# Patient Record
Sex: Male | Born: 1940 | Race: White | Hispanic: No | Marital: Married | Smoking: Former smoker
Health system: Southern US, Community
[De-identification: ages and names within clinical notes are randomized; demographics above are authoritative.]

## PROBLEM LIST (undated history)

## (undated) DIAGNOSIS — R040 Epistaxis: Secondary | ICD-10-CM

## (undated) DIAGNOSIS — G4730 Sleep apnea, unspecified: Secondary | ICD-10-CM

## (undated) DIAGNOSIS — J449 Chronic obstructive pulmonary disease, unspecified: Secondary | ICD-10-CM

## (undated) DIAGNOSIS — C801 Malignant (primary) neoplasm, unspecified: Secondary | ICD-10-CM

## (undated) DIAGNOSIS — I499 Cardiac arrhythmia, unspecified: Secondary | ICD-10-CM

## (undated) DIAGNOSIS — L719 Rosacea, unspecified: Secondary | ICD-10-CM

## (undated) DIAGNOSIS — H9193 Unspecified hearing loss, bilateral: Secondary | ICD-10-CM

## (undated) DIAGNOSIS — M1990 Unspecified osteoarthritis, unspecified site: Secondary | ICD-10-CM

## (undated) DIAGNOSIS — R51 Headache: Secondary | ICD-10-CM

## (undated) DIAGNOSIS — K219 Gastro-esophageal reflux disease without esophagitis: Secondary | ICD-10-CM

## (undated) DIAGNOSIS — Z9109 Other allergy status, other than to drugs and biological substances: Secondary | ICD-10-CM

## (undated) DIAGNOSIS — K649 Unspecified hemorrhoids: Secondary | ICD-10-CM

## (undated) DIAGNOSIS — E7800 Pure hypercholesterolemia, unspecified: Secondary | ICD-10-CM

## (undated) DIAGNOSIS — J45909 Unspecified asthma, uncomplicated: Secondary | ICD-10-CM

## (undated) DIAGNOSIS — R0602 Shortness of breath: Secondary | ICD-10-CM

## (undated) DIAGNOSIS — Z8619 Personal history of other infectious and parasitic diseases: Secondary | ICD-10-CM

## (undated) DIAGNOSIS — H409 Unspecified glaucoma: Secondary | ICD-10-CM

## (undated) DIAGNOSIS — Z87442 Personal history of urinary calculi: Secondary | ICD-10-CM

## (undated) DIAGNOSIS — I10 Essential (primary) hypertension: Secondary | ICD-10-CM

## (undated) HISTORY — PX: TYMPANOSTOMY TUBE PLACEMENT: SHX32

## (undated) HISTORY — PX: SKIN CANCER DESTRUCTION: SHX778

## (undated) HISTORY — PX: HERNIA REPAIR: SHX51

## (undated) HISTORY — PX: ORCHIECTOMY: SHX2116

## (undated) HISTORY — PX: HEMORRHOID SURGERY: SHX153

## (undated) HISTORY — PX: JOINT REPLACEMENT: SHX530

---

## 1969-05-02 DIAGNOSIS — Z87442 Personal history of urinary calculi: Secondary | ICD-10-CM

## 1969-05-02 HISTORY — PX: PROSTATE SURGERY: SHX751

## 1969-05-02 HISTORY — DX: Personal history of urinary calculi: Z87.442

## 1969-05-02 HISTORY — PX: MOUTH SURGERY: SHX715

## 1979-05-03 HISTORY — PX: VASECTOMY: SHX75

## 1998-09-01 HISTORY — PX: CATARACT EXTRACTION, BILATERAL: SHX1313

## 1998-09-01 HISTORY — PX: EYE SURGERY: SHX253

## 1998-09-01 HISTORY — PX: CARPAL TUNNEL RELEASE: SHX101

## 2000-04-06 ENCOUNTER — Encounter: Admission: RE | Admit: 2000-04-06 | Discharge: 2000-04-06 | Payer: Self-pay | Admitting: Urology

## 2000-04-06 ENCOUNTER — Encounter: Payer: Self-pay | Admitting: Urology

## 2000-04-13 ENCOUNTER — Encounter: Admission: RE | Admit: 2000-04-13 | Discharge: 2000-04-13 | Payer: Self-pay | Admitting: Urology

## 2000-04-13 ENCOUNTER — Encounter: Payer: Self-pay | Admitting: Urology

## 2001-03-23 ENCOUNTER — Ambulatory Visit (HOSPITAL_BASED_OUTPATIENT_CLINIC_OR_DEPARTMENT_OTHER): Admission: RE | Admit: 2001-03-23 | Discharge: 2001-03-24 | Payer: Self-pay | Admitting: Orthopedic Surgery

## 2003-10-16 ENCOUNTER — Ambulatory Visit (HOSPITAL_BASED_OUTPATIENT_CLINIC_OR_DEPARTMENT_OTHER): Admission: RE | Admit: 2003-10-16 | Discharge: 2003-10-16 | Payer: Self-pay | Admitting: Pulmonary Disease

## 2004-07-09 ENCOUNTER — Ambulatory Visit: Payer: Self-pay | Admitting: Pulmonary Disease

## 2004-08-07 ENCOUNTER — Ambulatory Visit (HOSPITAL_BASED_OUTPATIENT_CLINIC_OR_DEPARTMENT_OTHER): Admission: RE | Admit: 2004-08-07 | Discharge: 2004-08-07 | Payer: Self-pay | Admitting: Urology

## 2004-08-07 ENCOUNTER — Ambulatory Visit (HOSPITAL_COMMUNITY): Admission: RE | Admit: 2004-08-07 | Discharge: 2004-08-07 | Payer: Self-pay | Admitting: Urology

## 2004-09-06 ENCOUNTER — Ambulatory Visit: Payer: Self-pay | Admitting: Pulmonary Disease

## 2004-09-12 ENCOUNTER — Ambulatory Visit: Payer: Self-pay | Admitting: Family Medicine

## 2004-10-07 ENCOUNTER — Ambulatory Visit: Payer: Self-pay | Admitting: Pulmonary Disease

## 2004-11-05 ENCOUNTER — Ambulatory Visit: Payer: Self-pay | Admitting: Family Medicine

## 2005-01-07 ENCOUNTER — Ambulatory Visit: Payer: Self-pay | Admitting: Family Medicine

## 2005-01-14 ENCOUNTER — Ambulatory Visit: Payer: Self-pay | Admitting: Pulmonary Disease

## 2005-05-06 ENCOUNTER — Ambulatory Visit: Payer: Self-pay | Admitting: Family Medicine

## 2005-07-01 ENCOUNTER — Ambulatory Visit: Payer: Self-pay | Admitting: Pulmonary Disease

## 2005-07-08 ENCOUNTER — Ambulatory Visit (HOSPITAL_COMMUNITY): Admission: RE | Admit: 2005-07-08 | Discharge: 2005-07-08 | Payer: Self-pay | Admitting: Orthopedic Surgery

## 2005-07-08 ENCOUNTER — Ambulatory Visit (HOSPITAL_BASED_OUTPATIENT_CLINIC_OR_DEPARTMENT_OTHER): Admission: RE | Admit: 2005-07-08 | Discharge: 2005-07-08 | Payer: Self-pay | Admitting: Orthopedic Surgery

## 2005-07-28 ENCOUNTER — Ambulatory Visit (HOSPITAL_BASED_OUTPATIENT_CLINIC_OR_DEPARTMENT_OTHER): Admission: RE | Admit: 2005-07-28 | Discharge: 2005-07-28 | Payer: Self-pay | Admitting: Pulmonary Disease

## 2005-08-06 ENCOUNTER — Ambulatory Visit: Payer: Self-pay | Admitting: Pulmonary Disease

## 2005-08-12 ENCOUNTER — Ambulatory Visit: Payer: Self-pay | Admitting: Pulmonary Disease

## 2005-08-26 ENCOUNTER — Ambulatory Visit: Payer: Self-pay | Admitting: Family Medicine

## 2005-10-01 ENCOUNTER — Ambulatory Visit: Payer: Self-pay | Admitting: Pulmonary Disease

## 2005-12-16 ENCOUNTER — Ambulatory Visit: Payer: Self-pay | Admitting: Pulmonary Disease

## 2006-02-24 ENCOUNTER — Ambulatory Visit: Payer: Self-pay | Admitting: Pulmonary Disease

## 2006-05-25 ENCOUNTER — Ambulatory Visit: Payer: Self-pay | Admitting: Pulmonary Disease

## 2006-11-09 ENCOUNTER — Ambulatory Visit: Payer: Self-pay | Admitting: Pulmonary Disease

## 2007-06-24 ENCOUNTER — Ambulatory Visit: Payer: Self-pay | Admitting: Pulmonary Disease

## 2007-07-16 DIAGNOSIS — I10 Essential (primary) hypertension: Secondary | ICD-10-CM | POA: Insufficient documentation

## 2007-07-16 DIAGNOSIS — G4733 Obstructive sleep apnea (adult) (pediatric): Secondary | ICD-10-CM

## 2007-07-16 DIAGNOSIS — E7800 Pure hypercholesterolemia, unspecified: Secondary | ICD-10-CM

## 2007-07-16 DIAGNOSIS — J342 Deviated nasal septum: Secondary | ICD-10-CM

## 2007-08-13 ENCOUNTER — Encounter: Payer: Self-pay | Admitting: Pulmonary Disease

## 2007-08-30 ENCOUNTER — Ambulatory Visit: Payer: Self-pay | Admitting: Pulmonary Disease

## 2007-12-20 ENCOUNTER — Ambulatory Visit: Payer: Self-pay | Admitting: Pulmonary Disease

## 2007-12-27 ENCOUNTER — Ambulatory Visit (HOSPITAL_BASED_OUTPATIENT_CLINIC_OR_DEPARTMENT_OTHER): Admission: RE | Admit: 2007-12-27 | Discharge: 2007-12-27 | Payer: Self-pay | Admitting: Pulmonary Disease

## 2007-12-27 ENCOUNTER — Ambulatory Visit: Payer: Self-pay | Admitting: Pulmonary Disease

## 2008-02-08 ENCOUNTER — Ambulatory Visit: Payer: Self-pay | Admitting: Pulmonary Disease

## 2008-02-09 ENCOUNTER — Telehealth (INDEPENDENT_AMBULATORY_CARE_PROVIDER_SITE_OTHER): Payer: Self-pay | Admitting: *Deleted

## 2008-06-06 ENCOUNTER — Ambulatory Visit: Payer: Self-pay | Admitting: Pulmonary Disease

## 2008-06-06 DIAGNOSIS — J310 Chronic rhinitis: Secondary | ICD-10-CM

## 2008-12-11 ENCOUNTER — Ambulatory Visit: Payer: Self-pay | Admitting: Pulmonary Disease

## 2009-03-23 ENCOUNTER — Ambulatory Visit: Payer: Self-pay | Admitting: Pulmonary Disease

## 2009-10-03 ENCOUNTER — Ambulatory Visit (HOSPITAL_BASED_OUTPATIENT_CLINIC_OR_DEPARTMENT_OTHER): Admission: RE | Admit: 2009-10-03 | Discharge: 2009-10-03 | Payer: Self-pay | Admitting: Orthopedic Surgery

## 2009-10-04 ENCOUNTER — Ambulatory Visit: Payer: Self-pay | Admitting: Vascular Surgery

## 2009-10-04 ENCOUNTER — Encounter (INDEPENDENT_AMBULATORY_CARE_PROVIDER_SITE_OTHER): Payer: Self-pay | Admitting: Orthopedic Surgery

## 2009-10-04 ENCOUNTER — Ambulatory Visit
Admission: RE | Admit: 2009-10-04 | Discharge: 2009-10-04 | Payer: Self-pay | Source: Home / Self Care | Admitting: Orthopedic Surgery

## 2009-11-19 ENCOUNTER — Ambulatory Visit: Payer: Self-pay | Admitting: Pulmonary Disease

## 2009-11-19 DIAGNOSIS — R040 Epistaxis: Secondary | ICD-10-CM | POA: Insufficient documentation

## 2010-04-17 ENCOUNTER — Inpatient Hospital Stay (HOSPITAL_COMMUNITY): Admission: RE | Admit: 2010-04-17 | Discharge: 2010-04-22 | Payer: Self-pay | Admitting: Orthopedic Surgery

## 2010-10-01 NOTE — Assessment & Plan Note (Signed)
Summary: rov//mbw   Primary Provider/Referring Provider:  Dr. Dina Rich  CC:  OSA 6 month follow-up.  The patient says he is doing well on CPAP.  No complaints today.Marc Gutierrez  History of Present Illness: I saw Mr. Mckinstry in follow up for his moderate OSA on BiPAP 15/11.  He has been sleeping well since he got his new mask.  His wife does not hear him snore, when he using his machine.  He notices a difference when he does not use his machine for the whole night.  He feels his energy level is good during the day, and does not feel the need to nap as much as before.  He does feel the air in his mask gets dry.  He is not sure how to use the humidifer.  He has been getting occasional nose bleeds.      Current Medications (verified): 1)  Multivitamins   Tabs (Multiple Vitamin) .... Take One Tab By Mouth Once Daily 2)  Simvastatin 40 Mg Tabs (Simvastatin) .Marc Gutierrez.. 1 By Mouth Daily 3)  Niaspan 750 Mg Cr-Tabs (Niacin (Antihyperlipidemic)) .Marc Gutierrez.. 1 By Mouth Daily 4)  Fenofibrate Micronized 200 Mg Caps (Fenofibrate Micronized) .Marc Gutierrez.. 1 By Mouth Daily 5)  Lisinopril-Hydrochlorothiazide 20-25 Mg Tabs (Lisinopril-Hydrochlorothiazide) .Marc Gutierrez.. 1 By Mouth Daily  Allergies (verified): No Known Drug Allergies  Past History:  Past Medical History: Reviewed history from 12/11/2008 and no changes required. 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 Osborn Coho; not a good surgical candidate Hypertension Dyslipidemia Bladder outlet obstruction  Past Surgical History: Reviewed history from 12/11/2008 and no changes required. Left Carpal Tunnel      - Dr. Molly Maduro Sypher 07/02 Cystoscopy       - Dr. Larey Dresser 12/05 Right Carpal Tunnel      - Dr. Josephine Igo 11/06  Vital Signs:  Patient profile:   70 year old male Height:      71 inches (180.34 cm) Weight:      227 pounds (103.18 kg) BMI:      31.77 O2 Sat:      96 % on Room air Temp:     98.3 degrees F (36.83 degrees C) oral Pulse rate:   101 / minute BP sitting:   132 / 66  (right arm) Cuff size:   large  Vitals Entered By: Michel Bickers CMA (November 19, 2009 3:06 PM)  O2 Sat at Rest %:  96 O2 Flow:  Room air  Physical Exam  General:  normal appearance and obese.   Nose:  clear nasal discharge and deviated septum.   Mouth:  no oral lesion Neck:  no LAN Lungs:  no wheeze Heart:  regular rhythm, normal rate, and no murmurs.   Extremities:  no edema   Impression & Recommendations:  Problem # 1:  OBSTRUCTIVE SLEEP APNEA (ICD-327.23)  He is doing well on his current set up.  Advised him on how to adjust the humidity on his machine.  Problem # 2:  EPISTAXIS (ICD-784.7) Advised him to follow up with his primary physician if this recurs.  Hopefully adjusting the temperature on his CPAP humidifer will help to alleviate this.  Complete Medication List: 1)  Multivitamins Tabs (Multiple vitamin) .... Take one tab by mouth once daily 2)  Simvastatin 40 Mg Tabs (Simvastatin) .Marc Gutierrez.. 1 by mouth daily  3)  Niaspan 750 Mg Cr-tabs (Niacin (antihyperlipidemic)) .Marc Gutierrez.. 1 by mouth daily 4)  Fenofibrate Micronized 200 Mg Caps (Fenofibrate micronized) .Marc Gutierrez.. 1 by mouth daily 5)  Lisinopril-hydrochlorothiazide 20-25 Mg Tabs (Lisinopril-hydrochlorothiazide) .Marc Gutierrez.. 1 by mouth daily  Other Orders: Est. Patient Level III (16109)  Patient Instructions: 1)  Follow up in one year

## 2010-10-21 ENCOUNTER — Other Ambulatory Visit: Payer: Self-pay | Admitting: Orthopedic Surgery

## 2010-10-21 ENCOUNTER — Encounter (HOSPITAL_COMMUNITY): Payer: Medicare Other

## 2010-10-21 ENCOUNTER — Other Ambulatory Visit (HOSPITAL_COMMUNITY): Payer: Self-pay

## 2010-10-21 DIAGNOSIS — Z01810 Encounter for preprocedural cardiovascular examination: Secondary | ICD-10-CM | POA: Insufficient documentation

## 2010-10-21 DIAGNOSIS — Z01812 Encounter for preprocedural laboratory examination: Secondary | ICD-10-CM | POA: Insufficient documentation

## 2010-10-21 LAB — COMPREHENSIVE METABOLIC PANEL
AST: 27 U/L (ref 0–37)
Albumin: 4.4 g/dL (ref 3.5–5.2)
BUN: 23 mg/dL (ref 6–23)
Chloride: 105 mEq/L (ref 96–112)
Creatinine, Ser: 1.3 mg/dL (ref 0.4–1.5)
GFR calc Af Amer: >60 mL/min (ref >60)
Total Bilirubin: 0.5 mg/dL (ref 0.3–1.2)
Total Protein: 7.5 g/dL (ref 6.0–8.3)

## 2010-10-21 LAB — CBC
HCT: 39.2 % (ref 39.0–52.0)
Hemoglobin: 12.9 g/dL — ABNORMAL LOW (ref 13.0–17.0)
MCH: 31.3 pg (ref 26.0–34.0)
MCHC: 32.9 g/dL (ref 30.0–36.0)
MCV: 95.1 fL (ref 78.0–100.0)
Platelets: 221 10*3/uL (ref 150–400)
RBC: 4.12 MIL/uL — ABNORMAL LOW (ref 4.22–5.81)
RDW: 14 % (ref 11.5–15.5)
WBC: 5.1 10*3/uL (ref 4.0–10.5)

## 2010-10-21 LAB — URINALYSIS, ROUTINE W REFLEX MICROSCOPIC
Bilirubin Urine: NEGATIVE
Ketones, ur: NEGATIVE mg/dL
Specific Gravity, Urine: 1.021 (ref 1.005–1.030)
Urine Glucose, Fasting: NEGATIVE mg/dL
pH: 5.5 (ref 5.0–8.0)

## 2010-10-21 LAB — APTT: aPTT: 32 seconds (ref 24–37)

## 2010-10-21 LAB — SURGICAL PCR SCREEN: MRSA, PCR: NEGATIVE

## 2010-10-21 LAB — PROTIME-INR: INR: 1.04 (ref 0.00–1.49)

## 2010-10-28 ENCOUNTER — Inpatient Hospital Stay (HOSPITAL_COMMUNITY)
Admission: RE | Admit: 2010-10-28 | Discharge: 2010-11-02 | DRG: 470 | Disposition: A | Discharge status: Home Health | Payer: Medicare Other | Source: Ambulatory Visit | Attending: Orthopedic Surgery | Admitting: Orthopedic Surgery

## 2010-10-28 DIAGNOSIS — M1710 Unilateral primary osteoarthritis, unspecified knee: Principal | ICD-10-CM | POA: Diagnosis present

## 2010-10-28 DIAGNOSIS — E669 Obesity, unspecified: Secondary | ICD-10-CM | POA: Diagnosis present

## 2010-10-28 DIAGNOSIS — I10 Essential (primary) hypertension: Secondary | ICD-10-CM | POA: Diagnosis present

## 2010-10-28 DIAGNOSIS — D649 Anemia, unspecified: Secondary | ICD-10-CM | POA: Diagnosis not present

## 2010-10-28 DIAGNOSIS — J9819 Other pulmonary collapse: Secondary | ICD-10-CM | POA: Diagnosis not present

## 2010-10-28 DIAGNOSIS — I498 Other specified cardiac arrhythmias: Secondary | ICD-10-CM | POA: Diagnosis not present

## 2010-10-28 DIAGNOSIS — E119 Type 2 diabetes mellitus without complications: Secondary | ICD-10-CM | POA: Diagnosis present

## 2010-10-28 DIAGNOSIS — H409 Unspecified glaucoma: Secondary | ICD-10-CM | POA: Diagnosis present

## 2010-10-28 DIAGNOSIS — I491 Atrial premature depolarization: Secondary | ICD-10-CM | POA: Diagnosis not present

## 2010-10-28 DIAGNOSIS — G4733 Obstructive sleep apnea (adult) (pediatric): Secondary | ICD-10-CM | POA: Diagnosis present

## 2010-10-28 DIAGNOSIS — Z01812 Encounter for preprocedural laboratory examination: Secondary | ICD-10-CM

## 2010-10-28 LAB — TYPE AND SCREEN
ABO/RH(D): A POS
Antibody Screen: NEGATIVE

## 2010-10-28 LAB — GLUCOSE, CAPILLARY: Glucose-Capillary: 145 mg/dL — ABNORMAL HIGH (ref 70–99)

## 2010-10-29 LAB — CBC
MCH: 29.9 pg (ref 26.0–34.0)
MCHC: 32.1 g/dL (ref 30.0–36.0)
MCV: 93.3 fL (ref 78.0–100.0)
Platelets: 228 10*3/uL (ref 150–400)
RBC: 3.41 MIL/uL — ABNORMAL LOW (ref 4.22–5.81)
RDW: 13.6 % (ref 11.5–15.5)

## 2010-10-29 LAB — BASIC METABOLIC PANEL
GFR calc non Af Amer: 49 mL/min — ABNORMAL LOW (ref >60)
Glucose, Bld: 203 mg/dL — ABNORMAL HIGH (ref 70–99)
Potassium: 4.2 mEq/L (ref 3.5–5.1)
Sodium: 133 mEq/L — ABNORMAL LOW (ref 135–145)

## 2010-10-29 LAB — PROTIME-INR
INR: 1.19 (ref 0.00–1.49)
Prothrombin Time: 15.3 seconds — ABNORMAL HIGH (ref 11.6–15.2)

## 2010-10-30 ENCOUNTER — Inpatient Hospital Stay (HOSPITAL_COMMUNITY): Payer: Medicare Other

## 2010-10-30 LAB — BASIC METABOLIC PANEL
Calcium: 8.9 mg/dL (ref 8.4–10.5)
GFR calc Af Amer: >60 mL/min (ref >60)
GFR calc non Af Amer: >60 mL/min (ref >60)
Sodium: 133 mEq/L — ABNORMAL LOW (ref 135–145)

## 2010-10-30 LAB — URINALYSIS, ROUTINE W REFLEX MICROSCOPIC
Nitrite: NEGATIVE
Protein, ur: NEGATIVE mg/dL
Specific Gravity, Urine: 1.015 (ref 1.005–1.030)
Urobilinogen, UA: 1 mg/dL (ref 0.0–1.0)

## 2010-10-30 LAB — PROTIME-INR
INR: 1.51 — ABNORMAL HIGH (ref 0.00–1.49)
Prothrombin Time: 18.4 seconds — ABNORMAL HIGH (ref 11.6–15.2)

## 2010-10-30 LAB — URINE MICROSCOPIC-ADD ON

## 2010-10-30 LAB — CBC
Platelets: 204 10*3/uL (ref 150–400)
RBC: 3.1 MIL/uL — ABNORMAL LOW (ref 4.22–5.81)
WBC: 10.9 10*3/uL — ABNORMAL HIGH (ref 4.0–10.5)

## 2010-10-31 ENCOUNTER — Ambulatory Visit (HOSPITAL_COMMUNITY): Admit: 2010-10-31 | Payer: Self-pay | Admitting: Surgery

## 2010-10-31 DIAGNOSIS — R002 Palpitations: Secondary | ICD-10-CM

## 2010-10-31 LAB — BASIC METABOLIC PANEL
Chloride: 99 mEq/L (ref 96–112)
GFR calc non Af Amer: 57 mL/min — ABNORMAL LOW (ref >60)
Glucose, Bld: 194 mg/dL — ABNORMAL HIGH (ref 70–99)
Potassium: 3.9 mEq/L (ref 3.5–5.1)
Sodium: 133 mEq/L — ABNORMAL LOW (ref 135–145)

## 2010-10-31 LAB — CK TOTAL AND CKMB (NOT AT ARMC)
CK, MB: 0.9 ng/mL (ref 0.3–4.0)
Relative Index: INVALID (ref 0.0–2.5)
Relative Index: INVALID (ref 0.0–2.5)
Total CK: 76 U/L (ref 7–232)

## 2010-10-31 LAB — CBC
MCV: 91.8 fL (ref 78.0–100.0)
WBC: 11.9 10*3/uL — ABNORMAL HIGH (ref 4.0–10.5)

## 2010-10-31 LAB — URINE CULTURE: Culture  Setup Time: 201203010115

## 2010-10-31 LAB — TROPONIN I
Troponin I: 0.02 ng/mL (ref 0.00–0.06)
Troponin I: 0.02 ng/mL (ref 0.00–0.06)

## 2010-10-31 LAB — PROTIME-INR: Prothrombin Time: 24.2 seconds — ABNORMAL HIGH (ref 11.6–15.2)

## 2010-11-01 ENCOUNTER — Inpatient Hospital Stay (HOSPITAL_COMMUNITY): Payer: Medicare Other

## 2010-11-01 DIAGNOSIS — I059 Rheumatic mitral valve disease, unspecified: Secondary | ICD-10-CM

## 2010-11-01 LAB — PROTIME-INR
INR: 2.7 — ABNORMAL HIGH (ref 0.00–1.49)
Prothrombin Time: 28.8 seconds — ABNORMAL HIGH (ref 11.6–15.2)

## 2010-11-01 LAB — CK TOTAL AND CKMB (NOT AT ARMC): Relative Index: INVALID (ref 0.0–2.5)

## 2010-11-01 LAB — CBC
Platelets: 185 10*3/uL (ref 150–400)
RDW: 14.8 % (ref 11.5–15.5)
WBC: 12 10*3/uL — ABNORMAL HIGH (ref 4.0–10.5)

## 2010-11-07 NOTE — H&P (Signed)
NAME:  Marc Gutierrez, Marc Gutierrez                ACCOUNT NO.:  [id number]  MEDICAL RECORD NO.:  [id number]           PATIENT TYPE:  I  LOCATION:  1602                         FACILITY:  [facility]  PHYSICIAN:  Ollen Gross, M.D.    DATE OF BIRTH:  May 29, 1941  DATE OF ADMISSION:  10/28/2010 DATE OF DISCHARGE:  11/02/2010                             HISTORY & PHYSICAL   CHIEF COMPLAINT:  Right knee pain.  HISTORY OF PRESENT ILLNESS:  The patient is a 70 year old male who has been seen by Dr. Lequita Halt for ongoing right knee pain.  He had a left total knee done back in August 2011, doing well with the left knee.  The right knee continues to be problematic and now he presents to have the other side done.  ALLERGIES:  No known drug allergies.  CURRENT MEDICATIONS:  Lisinopril, fenofibrate, omeprazole, hydrochlorothiazide, simvastatin, and Lumigan eye drops.  PAST MEDICAL HISTORY: 1. Cataracts. 2. Glaucoma. 3. History of shingles. 4. Sleep apnea. 5. Right ear hearing loss. 6. Hypertension. 7. Hypercholesterolemia. 8. Reflux disease. 9. Hemorrhoids. 10.Diabetes mellitus. 11.History of renal calculi. 12.Childhood illnesses of measles and mumps. 13.Rosacea. 14.History of skin cancer.  PAST SURGICAL HISTORY:  Hernia repair twice, hemorrhoid surgery, left total knee replacement, skin surgery secondary to skin cancer, and cataract surgery.  FAMILY HISTORY:  Negative.  SOCIAL HISTORY:  Married.  Past smoker.  Three beers over a several- month period, seldom intake of alcohol.  He does have a caregiver lined up.  He does have a living will and healthcare power of attorney.  REVIEW OF SYSTEMS:  GENERAL:  No fever, chills, or night sweats.  NEURO: No seizures, syncope, or paralysis.  RESPIRATORY:  No shortness of breath, productive cough, or hemoptysis.  CARDIOVASCULAR:  No chest pain or orthopnea.  GI:  No nausea, vomiting, diarrhea, or constipation.  GU: No dysuria, hematuria, or discharge.   MUSCULOSKELETAL:  Right knee.  PHYSICAL EXAMINATION:  VITAL SIGNS:  Pulse 84, respirations 12, blood pressure 126/60. GENERAL:  A 70 year old white male, well nourished, well developed, in no acute distress.  He is alert, oriented, and cooperative.  He is accompanied by his wife. HEENT:  Normocephalic, atraumatic.  Pupils are round and reactive.  EOMs intact.  Never wore glasses.  He does have a right ear hearing aid.  He does have dentures, upper and lower. NECK:  Supple. CHEST:  Clear. HEART:  Regular rate and rhythm with a grade 2/6 to 3/6 systolic murmur, best heard over aortic and mitral points.  Also, slightly over pulmonary point. ABDOMEN:  Soft, nontender.  Bowel sounds present. RECTAL:  Not done, not pertinent to present illness. BREASTS:  Not done, not pertinent to present illness. GENITALIA:  Not done, not pertinent to present illness. EXTREMITIES:  Right knee tender to palpation.  No instability.  Marked crepitus is noted on passive range of motion.  IMPRESSION:  Osteoarthritis, right knee.  PLAN:  The patient admitted to [facility] to undergo a right total knee replacement arthroplasty.  Surgery will be performed by Dr. Ollen Gross.  The patient has been seen preop by Dr. Molly Maduro  Dough and felt to be stable for surgery.     Marc Gutierrez, P.A.C.   ______________________________ Ollen Gross, M.D.    ALP/MEDQ  D:  11/03/2010  T:  11/04/2010  Job:  [id number]  cc:   Dina Rich Fax: [phone]  Electronically Signed by Patrica Duel P.A.C. on 11/04/2010 10:48:01 AM Electronically Signed by Ollen Gross M.D. on 11/06/2010 03:47:20 PM

## 2010-11-07 NOTE — Op Note (Signed)
NAME:  Marc Gutierrez, Marc Gutierrez                ACCOUNT NO.:  [id number]  MEDICAL RECORD NO.:  [id number]           PATIENT TYPE:  I  LOCATION:  0006                         FACILITY:  [facility]  PHYSICIAN:  Ollen Gross, M.D.    DATE OF BIRTH:  1940-09-09  DATE OF PROCEDURE:  10/28/2010 DATE OF DISCHARGE:                              OPERATIVE REPORT   PREOPERATIVE DIAGNOSIS:  Osteoarthritis, right knee.  POSTOPERATIVE DIAGNOSIS:  Osteoarthritis, right knee.  PROCEDURE:  Right total knee arthroplasty.  SURGEON:  Ollen Gross, MD  ASSISTANT:  Alexzandrew L. Perkins, PAC  ANESTHESIA:  General.  ESTIMATED BLOOD LOSS:  Minimal.  DRAIN:  Hemovac x1.  TOURNIQUET TIME:  34 minutes at 300 mmHg.  COMPLICATIONS:  None.  CONDITION:  Stable to Recovery.  BRIEF CLINICAL NOTE:  Marc Gutierrez is a 70 year old male who has advanced arthritis of the right knee with progressively worsening pain and dysfunction.  He has had a previous successful left total knee arthroplasty, he presents now for right total knee arthroplasty.  PROCEDURE IN DETAIL:  After successful administration of general anesthetic, a tourniquet was placed high on his right thigh and his right lower extremity was prepped and draped in usual sterile fashion. Extremity was wrapped in an Esmarch, knee flexed, tourniquet inflated to 300 mmHg.  Midline incision was made with a 10 blade through subcutaneous tissue to the level of the extensor mechanism.  A fresh blade was used to make a medial parapatellar arthrotomy.  Soft tissue on the proximal medial tibia was subperiosteally elevated to the joint line with a knife into the semimembranosus bursa with a Cobb elevator.  Soft tissue laterally was elevated with attention being paid to avoid any patellar tendon on tibial tubercle.  The patella was everted, knee flexed to 90 degrees, and ACL and PCL removed.  Drill was used to create a starting hole in the distal femur and the canal was  thoroughly irrigated.  A 5 degree right valgus alignment guide was placed and the distal femoral cutting block was pinned to remove 10 mm off the distal femur.  Distal femoral resection was made with an oscillating saw. Sizing guide was placed and size 4 was most appropriate.  Rotations marked off the epicondylar axis.  Size 4 cutting block was placed and the anterior and posterior chamfer cuts made.  Tibia subluxed forward and the menisci removed.  Extramedullary tibial alignment guide was placed referencing proximally at the medial aspect of the tibial tubercle and distally along the 2nd metatarsal axis and tibial crest.  The block was pinned to remove 2 mm off the more deficient medial side.  Tibial resection was made with an oscillating saw.  Size 5 was most appropriate tibial component and the proximal tibia was prepared with a modular drill and keel punched for the size 5. Femoral preparation was completed with the intercondylar cut for the size 4.  Size 5 mobile-bearing tibial trial, size 4 posterior stabilized femoral trial, and a 10-mm posterior stabilized rotating platform insert trial were placed.  With the 10, full extension was achieved, with excellent varus-valgus and anterior-posterior balance, throughout full  range of motion.  The patella was everted and thickness measured to be 27 mm. Freehand resection was taken to 15 mm, 41 template was placed, lug holes were drilled, trial patella was placed and it tracked normally. Osteophytes removed off the posterior femur with the trial in place. All trials were removed and the cut bone surface was prepared with pulsatile lavage.  Cement was mixed and once ready for implantation, size 5 mobile-bearing tibial tray, size 4 posterior stabilized femur, and a 41 patella were cemented into place, and patella was held with a clamp.  Trial 10-mm insert was placed, knee held in full extension, all extruded cement removed.  When cement  was fully hardened, then the permanent 10 mm posterior stabilized rotating platform insert was placed in the tibial tray.  The wound was copiously irrigated with saline solution and the arthrotomy closed over Hemovac drain with interrupted #1 PDS.  Flexion against gravity was about 135 degrees, patella tracked normally.  Tourniquet was released at total time of 34 minutes. Subcutaneous was then closed with interrupted 2-0 Vicryl and subcuticular with running 4-0 Monocryl.  Catheter for the Marcaine pain pump was placed and pump was initiated.  The incision was then cleaned and dried and Steri-Strips and a bulky sterile dressing applied. He was then placed into a knee immobilizer, awakened, and transported to Recovery in stable condition.     Ollen Gross, M.D.     FA/MEDQ  D:  10/28/2010  T:  10/28/2010  Job:  [id number]  Electronically Signed by Ollen Gross M.D. on 11/06/2010 03:47:16 PM

## 2010-11-14 LAB — GLUCOSE, CAPILLARY
Glucose-Capillary: 113 mg/dL — ABNORMAL HIGH (ref 70–99)
Glucose-Capillary: 113 mg/dL — ABNORMAL HIGH (ref 70–99)
Glucose-Capillary: 117 mg/dL — ABNORMAL HIGH (ref 70–99)
Glucose-Capillary: 123 mg/dL — ABNORMAL HIGH (ref 70–99)
Glucose-Capillary: 125 mg/dL — ABNORMAL HIGH (ref 70–99)
Glucose-Capillary: 132 mg/dL — ABNORMAL HIGH (ref 70–99)
Glucose-Capillary: 141 mg/dL — ABNORMAL HIGH (ref 70–99)
Glucose-Capillary: 157 mg/dL — ABNORMAL HIGH (ref 70–99)
Glucose-Capillary: 158 mg/dL — ABNORMAL HIGH (ref 70–99)
Glucose-Capillary: 163 mg/dL — ABNORMAL HIGH (ref 70–99)
Glucose-Capillary: 193 mg/dL — ABNORMAL HIGH (ref 70–99)
Glucose-Capillary: 194 mg/dL — ABNORMAL HIGH (ref 70–99)
Glucose-Capillary: 97 mg/dL (ref 70–99)

## 2010-11-14 LAB — CBC
HCT: 25.7 % — ABNORMAL LOW (ref 39.0–52.0)
HCT: 28.9 % — ABNORMAL LOW (ref 39.0–52.0)
MCH: 32.5 pg (ref 26.0–34.0)
MCHC: 35.1 g/dL (ref 30.0–36.0)
MCV: 92.9 fL (ref 78.0–100.0)
Platelets: 155 10*3/uL (ref 150–400)
Platelets: 167 10*3/uL (ref 150–400)
Platelets: 200 10*3/uL (ref 150–400)
RBC: 2.39 MIL/uL — ABNORMAL LOW (ref 4.22–5.81)
RBC: 2.4 MIL/uL — ABNORMAL LOW (ref 4.22–5.81)
RBC: 2.77 MIL/uL — ABNORMAL LOW (ref 4.22–5.81)
RDW: 13.6 % (ref 11.5–15.5)
RDW: 14.1 % (ref 11.5–15.5)
RDW: 14.2 % (ref 11.5–15.5)
RDW: 14.2 % (ref 11.5–15.5)
WBC: 6.4 10*3/uL (ref 4.0–10.5)
WBC: 7.6 10*3/uL (ref 4.0–10.5)
WBC: 9.7 10*3/uL (ref 4.0–10.5)
WBC: 9.8 10*3/uL (ref 4.0–10.5)

## 2010-11-14 LAB — HEMOGLOBIN AND HEMATOCRIT, BLOOD: HCT: 26.5 % — ABNORMAL LOW (ref 39.0–52.0)

## 2010-11-14 LAB — CROSSMATCH: ABO/RH(D): A POS

## 2010-11-14 LAB — BASIC METABOLIC PANEL
BUN: 10 mg/dL (ref 6–23)
BUN: 12 mg/dL (ref 6–23)
CO2: 26 mEq/L (ref 19–32)
Calcium: 8.4 mg/dL (ref 8.4–10.5)
Chloride: 99 mEq/L (ref 96–112)
Creatinine, Ser: 1.36 mg/dL (ref 0.4–1.5)
Creatinine, Ser: 1.38 mg/dL (ref 0.4–1.5)
GFR calc non Af Amer: 52 mL/min — ABNORMAL LOW (ref >60)
Glucose, Bld: 207 mg/dL — ABNORMAL HIGH (ref 70–99)
Potassium: 3.5 mEq/L (ref 3.5–5.1)
Potassium: 3.7 mEq/L (ref 3.5–5.1)

## 2010-11-14 LAB — PROTIME-INR
INR: 1.28 (ref 0.00–1.49)
INR: 1.89 — ABNORMAL HIGH (ref 0.00–1.49)
INR: 1.91 — ABNORMAL HIGH (ref 0.00–1.49)
Prothrombin Time: 16.2 seconds — ABNORMAL HIGH (ref 11.6–15.2)
Prothrombin Time: 22 seconds — ABNORMAL HIGH (ref 11.6–15.2)

## 2010-11-14 LAB — ABO/RH: ABO/RH(D): A POS

## 2010-11-14 LAB — TYPE AND SCREEN: Antibody Screen: NEGATIVE

## 2010-11-15 LAB — CBC
MCH: 32.7 pg (ref 26.0–34.0)
MCV: 93 fL (ref 78.0–100.0)
Platelets: 184 10*3/uL (ref 150–400)
RBC: 3.76 MIL/uL — ABNORMAL LOW (ref 4.22–5.81)

## 2010-11-15 LAB — URINALYSIS, ROUTINE W REFLEX MICROSCOPIC
Glucose, UA: NEGATIVE mg/dL
Ketones, ur: NEGATIVE mg/dL
Protein, ur: NEGATIVE mg/dL

## 2010-11-15 LAB — COMPREHENSIVE METABOLIC PANEL
BUN: 37 mg/dL — ABNORMAL HIGH (ref 6–23)
CO2: 30 mEq/L (ref 19–32)
Chloride: 100 mEq/L (ref 96–112)
Creatinine, Ser: 1.64 mg/dL — ABNORMAL HIGH (ref 0.4–1.5)
GFR calc non Af Amer: 42 mL/min — ABNORMAL LOW (ref >60)
Total Bilirubin: 1.1 mg/dL (ref 0.3–1.2)

## 2010-11-15 LAB — APTT: aPTT: 34 seconds (ref 24–37)

## 2010-11-15 LAB — PROTIME-INR: INR: 1.12 (ref 0.00–1.49)

## 2010-11-20 LAB — POCT I-STAT 4, (NA,K, GLUC, HGB,HCT)
HCT: 37 % — ABNORMAL LOW (ref 39.0–52.0)
Hemoglobin: 12.6 g/dL — ABNORMAL LOW (ref 13.0–17.0)
Potassium: 4 mEq/L (ref 3.5–5.1)
Sodium: 141 mEq/L (ref 135–145)

## 2010-12-13 NOTE — Discharge Summary (Signed)
NAME:  Marc Gutierrez, Marc Gutierrez                ACCOUNT NO.:  [id number]  MEDICAL RECORD NO.:  [id number]           PATIENT TYPE:  I  LOCATION:  1602                         FACILITY:  [facility]  PHYSICIAN:  Ollen Gross, M.D.    DATE OF BIRTH:  03/28/1941  DATE OF ADMISSION:  10/28/2010 DATE OF DISCHARGE:  11/02/2010                              DISCHARGE SUMMARY   ADMITTING DIAGNOSES: 1. Osteoarthritis of the right knee. 2. Cataracts. 3. Glaucoma. 4. History of shingles. 5. Sleep apnea. 6. Right ear hearing loss. 7. Hypertension. 8. Hypercholesterolemia. 9. Reflux disease. 10.Hemorrhoids. 11.Diabetes mellitus. 12.History of renal calculi. 13.History of rosacea. 14.History of skin cancer. 15.Childhood illnesses of measles and mumps.  DISCHARGE DIAGNOSES: 1. Osteoarthritis of the right knee status post right total-knee     replacement arthroplasty. 2. Postoperative acute blood loss anemia, did not require transfusion. 3. Postoperative hyponatremia. 4. Cataracts. 5. Glaucoma. 6. History of shingles. 7. Sleep apnea. 8. Right ear hearing loss. 9. Hypertension. 10.Hypercholesterolemia. 11.Reflux disease. 12.Hemorrhoids. 13.Diabetes mellitus. 14.History of renal calculi. 15.History of rosacea. 16.History of skin cancer. 17.Childhood illnesses of measles and mumps. 18.Postoperative tachycardia/sinus tachycardia with premature atrial     contraction. 19.Postoperative atelectasis.  PROCEDURE:  October 28, 2010, right total-knee.  SURGEON:  Ollen Gross, M.D.  ASSISTANT:  Marc Gutierrez, P.A.C.  ANESTHESIA:  General.  CONSULTATIONS:  Cardiology.  LABORATORY DATA:  The admission laboratories are not on this chart.  The followup CBC on postoperative day #1 was 10.2 and the hemoglobin dropped down to 9.2.  Last noted H and H was 8.3 and 25.6.  The admission PT/INR are not on this chart, but serial prothrombin times were followed per Coumadin protocol.  Last noted  PT/INR 30.2 and 2.88.  The admission Chemistry panel is not on this chart so I do not know the starting sodium, but the sodium was noted at 133 on postoperative day #1 and remained at 133 throughout the hospital course.  Glucose was 203 on postoperative day #1 and came down to 194.  BUN was elevated on October 29, 2010, at 24 but came down to a normal level of 14 and creatinine remained normal throughout the hospital course.  The patient had one full set of cardiac enzymes x3 on October 31, 2010.  First set CK normal at 76, CK-MB normal at 0.9.  The troponin was normal at 0.02.  Second set on October 31, 2010, CK normal at 76, CK-MB normal at 1.0.  Troponin normal at 0.02.  The third set taken on November 01, 2010, CK normal at 88, CK-MB normal at 1, troponin 0.03.  Preoperative UA small hemoglobin, 0 to 2 red cells.  Otherwise negative.  Urine culture taken on October 30, 2010, no growth, negative.  X-RAYS:  Postoperative chest on October 30, 2010, no pneumonia, minimal left basilar linear atelectasis.  HOSPITAL COURSE:  The patient was admitted to [facility], taken to the OR and underwent the above-stated procedure without complication.  The patient tolerated the procedure well and was later transferred to the recovery room on the orthopedic floor.  He was started on p.o.  and IV analgesics for pain control following surgery. Started back on Coumadin postoperatively.  He was also given a Lovenox bridge until the INR was therapeutic.  He was started back on all of his home medications.  Had good urinary output.  Started getting up out of bed.  By day #2, the patient was doing fairly well.  Sodium was stable at 133.  Dressing was changed and the incision looked good.  The patient had a little low hemoglobin at 9.2, so he was started on iron supplementation.  Continued on the Lovenox.  On postoperative day #3, the patient felt a little bit of weakness and had not met all of the goals  and felt like he needed another day.  Chest x-ray was ordered and only showed mild atelectasis.  The urine was checked because of a temperature the night before.  Urine culture was negative. Unfortunately on the following day the patient had noticed tachycardia/sinus tachycardia with PAC so a Cardiology consult was called.  The patient was seen in evaluation by Cardiology and felt that the rapid heart rate with frequent PACs was noted to stress and the fever.  The echocardiogram was checked.  The patient denied any chest pain or dyspnea with the onset of the tachycardia and PAC.  Enzymes were also checked and enzymes were noted to be negative.  Heart rate did improve and by November 02, 2010, the patient had been seen by Cardiology and felt that heart rate had improved and felt to be ready for discharge and the patient was discharged home on November 02, 2010.  DISCHARGE INSTRUCTIONS:  The patient was discharged home on November 02, 2010.  DISCHARGE DIAGNOSES:  Please see above.  DISCHARGE MEDICATIONS:  Nu-Iron, Robaxin, Percocet, Coumadin.  Continue enteric-coated aspirin, fenofibrate, lisinopril/HCTZ, Lumigan eye drops, omeprazole, Rhinocort, simvastatin, Symbicort and triamcinolone cream.  DIET:  Heart-healthy diet.  ACTIVITY:  Total-hip protocol, weightbearing as tolerated.  FOLLOWUP:  Follow up in 2 weeks.  DISPOSITION:  Home.  CONDITION ON DISCHARGE:  Slowly improving.     Marc L. Julien Girt, P.A.C.   ______________________________ Ollen Gross, M.D.    ALP/MEDQ  D:  12/04/2010  T:  12/04/2010  Job:  [id number]  cc:   Dina Rich Fax: [phone]  Electronically Signed by Patrica Duel P.A.C. on 12/09/2010 08:20:55 AM Electronically Signed by Ollen Gross M.D. on 12/13/2010 09:00:52 AM

## 2010-12-21 NOTE — Consult Note (Signed)
NAME:  Marc Gutierrez, Marc Gutierrez                ACCOUNT NO.:  [id number]  MEDICAL RECORD NO.:  [id number]           PATIENT TYPE:  I  LOCATION:  1602                         [facility]ACILITY:  [facility]  PHYSICIAN:  Pricilla Riffle, MD, FACCDATE OF BIRTH:  11-Mar-1941  DATE OF CONSULTATION:  10/31/2010 DATE OF DISCHARGE:                                CONSULTATION   PRIMARY CARE PHYSICIAN:  Dr. Sol Passer.  PRIMARY CARDIOLOGIST:  None.  CHIEF COMPLAINT:  Irregular rapid heart beat.  HISTORY OF PRESENT ILLNESS:  Marc Gutierrez is a 70 year old male with no previous history of cardiac issues.  He was admitted on October 28, 2010, for right total knee replacement.  He was doing fairly well, but today he was noticed to have an elevated heart rate and some irregularity to his rhythm.  His EKG was not clearly sinus, so cardiology was asked to evaluate him.  Marc Gutierrez denies chest pain.  He has significant pain in his right knee and he does not believe he hurts anywhere else.  He has no awareness of an irregular or rapid heart rate.  He has never had presyncope or syncope.  He has no history of exertional chest pain.  He had significant pain control issues with his previous left total knee replacement and the issues now are not resolved as yet.  He has been given pain control medications as ordered by Dr. Lequita Halt with some ice packs and seems to be resting a little bit more comfortably now.  PAST MEDICAL HISTORY: 1. Osteoarthritis. 2. Gastroesophageal reflux disease. 3. History of herpes zoster. 4. Right hearing loss. 5. Hypertension. 6. Hyperlipidemia. 7. Diabetes. 8. Obesity. 9. OSA, on CPAP. 10.Nephrolithiasis. 11.Remote history of tobacco use.  PAST SURGICAL HISTORY:  He is status post left total knee replacement remotely as well as hernia x2, hemorrhoid surgery, skin cancer removal, and cataract surgery.  ALLERGIES:  No known drug allergies.  CURRENT MEDICATIONS: 1. Aspirin 81 mg a day. 2. Lumigan  eye drops nightly. 3. Dulcolax p.r.n. 4. Clotrimazole topically b.i.d. 5. Colace 100 mg b.i.d. 6. Fenofibrate 160 mg a day. 7. Prilosec 20 mg b.i.d. 8. Crestor 10 mg a day. 9. Coumadin. 10.P.r.n. pain control medications.  SOCIAL HISTORY:  Lives in [place] with his wife.  He is retired from [employer] [address]  He quit tobacco greater than 10 years ago with approximately 30-pack-year history.  He denies alcohol or drug abuse.  FAMILY HISTORY:  Both parents died in their 93s and he says neither one of them have heart issues.  No siblings have coronary artery disease.  REVIEW OF SYSTEMS:  The patient admits to Dr. Tenny Craw he does have occasional chest pain, but it is not exertional.  He has some chronic dyspnea on exertion that is not changed recently.  He has had no recent fever or chills. He had his left total knee replacement in August 2011 and feels like he did pretty well with that.  He has not had any melena. He has a history of occasional reflux symptoms.  Full 14-point review of systems is otherwise negative except as in HPI.  PHYSICAL  EXAMINATION:  VITAL SIGNS:  Temperature is 101.6, blood pressure 145/64, pulse 80, respiratory rate 19, O2 saturation 96% on 2 L. GENERAL:  He is a well-developed, well-nourished white male, in no acute distress. HEENT:  Normal. NECK:  There is no lymphadenopathy, thyromegaly, or JVD noted. CV:  His heart is slightly irregular in rate and rhythm with a 3/6 systolic ejection murmur noted at the left lower sternal border. CHEST:  Clear to auscultation bilaterally. SKIN:  No rashes or lesions are noted.  His right knee incision is without drainage. ABDOMEN:  Soft and nontender with active bowel sounds. EXTREMITIES:  There is no cyanosis, clubbing, or edema noted except at his right knee itself.  Distal pulses are intact in all 4 extremities. MUSCULOSKELETAL:  There is no joint deformity or effusions noted. NEURO:  He is alert and oriented, but  hears poorly on the right side. No focal deficits are noted.  IMAGING:  EKG is sinus tach with multiple PACs and right bundle-branch block, which was seen on an EKG done in February 2011.  Chest x-ray done yesterday showed no pneumonia, but atelectasis.  LABORATORY VALUES:  Hemoglobin 8.7, hematocrit 27, WBCs 11.9, platelets 193, INR 2.16.  Sodium 133, potassium 3.9, chloride 99, CO2 of 27, BUN 14, creatinine 1.25, glucose 194.  IMPRESSION:  Marc Gutierrez was seen today by Dr. Tenny Craw, the patient evaluated and the data reviewed.  His EKG was repeated and is clearly now sinus tach with PACs.  His elevated heart rate and PACs were exacerbated by pain, fever, and anemia.  His exam is remarkable for murmur at the apex.  It is appropriate to get an echo to evaluate the murmur and his valves.  The murmur may also be made worse by the ongoing fever and pain he is having.  His blood pressure is a little high at times, but this may also be secondary to his ongoing pain and fever.  We will leave the evaluation of his fever and pain control medications to Dr. Lequita Halt.     Theodore Demark, PA-C   ______________________________ Pricilla Riffle, MD, [facility]    RB/MEDQ  D:  10/31/2010  T:  11/01/2010  Job:  [id number]  Electronically Signed by Theodore Demark PA-C on 11/18/2010 06:32:04 AM Electronically Signed by Dietrich Pates MD FACC on 12/21/2010 10:08:10 PM

## 2011-01-14 NOTE — Procedures (Signed)
NAME:  Marc Gutierrez, Marc Gutierrez NO.:  [id number]   MEDICAL RECORD NO.:  [id number]          PATIENT TYPE:  OUT   LOCATION:  SLEEP CENTER                 FACILITY:  [facility]   PHYSICIAN:  Coralyn Helling, MD        DATE OF BIRTH:  [birth year]   DATE OF STUDY:  12/27/2007                            NOCTURNAL POLYSOMNOGRAM   REFERRING PHYSICIAN:  Coralyn Helling, MD   INDICATION FOR STUDY:  [name] is a 70 year old male who had a  diagnostic sleep study done on October 16, 2003, and was found to have  moderate obstructive sleep apnea with an apnea/hypopnea index of 24.  He  had a CPAP titration study done on July 28, 2005, and was started on  a CPAP of 14.  He was not able to tolerate this.  He was subsequently  started on auto-BiPAP but had difficulty with this.  He is referred to  the sleep lab for a BiPAP titration study.   Height is 5 feet 11 inches tall.  Weight is 225 pounds.  BMI is 31.  Neck size is 17 inches.   EPWORTH SLEEPINESS SCORE:  14.   MEDICATIONS:  Nexium, Simvastatin, multivitamin, lycopene, fish oil,  Luminal, Lisinopril, hydrochlorothiazide, and aspirin.   SLEEP ARCHITECTURE:  Sleep period time is 437 minutes.  Total sleep time  was 426 minutes.  Sleep efficiency is 96%.  Sleep latency is 4 minutes  which is reduced.  REM latency is 48 minutes which is reduced.  The  study was notable for lack of slow wave sleep.  The patient slept in  both the supine and non-supine position.   RESPIRATORY DATA:  The average respiratory rate was 14.  The patient was  titrated from a BiPAP pressure setting of 8/4 to 15/11.  At BiPAP  pressure setting of 15/11 the apnea/hypopnea index was reduced to 5.6.  At this pressure setting he was observed in both REM sleep and supine  sleep and snoring was eliminated.  Of note is that he was able to  tolerate a lower pressure setting in the non-supine position.   OXYGEN DATA:  The baseline oxygenation was 95%.  The oxygen  saturation  nadir was 82%.  At a BiPAP pressure setting of 15/11, the oxygen  saturation nadir was 87%.   CARDIAC DATA:  The average heart rate was 57 and the rhythm strip showed  normal sinus rhythm with sinus bradycardia and PVCs.   MOVEMENT-PARASOMNIA:  The periodic limb movement index was 13.  No other  abnormal behaviors were noted.   IMPRESSIONS-RECOMMENDATIONS:  This was a BiPAP titration study.  At a  BiPAP pressure setting of 15/11, the apnea/hypopnea index was reduced to  5.6.  At this pressure setting, he was observed in both REM sleep and  supine sleep.  I would recommend starting him on BiPAP at 15/11.  If he  has difficulty tolerating these high pressure  settings, it may be possible to try him on lower pressure settings in  conjunction with positional therapy.  He should also have an overnight  oximetry done once he is established on BiPAP therapy.  Coralyn Helling, MD  Diplomat, American Board of Sleep Medicine  Electronically Signed     VS/MEDQ  D:  12/30/2007 14:48:41  T:  12/30/2007 15:19:52  Job:  [id number]

## 2011-01-14 NOTE — Assessment & Plan Note (Signed)
[facility]                             PULMONARY OFFICE NOTE   MERRELL, BORSUK                       MRN:          [id number]  DATE:06/24/2007                            DOB:          [birth year]    I saw Marc Gutierrez in follow up today for his obstructive sleep apnea.   He is currently on BiPAP, and from what I can tell, he should be on a  pressure setting of 14/9. He is using a full facemask with heated  humidification. He says that he feels like he is having a lot of air  leak, and as a result, he feels like he is not getting enough pressure,  and sometimes it actually feels like he is getting too much pressure. He  is not having much problems as far as dryness in his nose or stuffiness  in his nose. He does occasionally get dryness in his mouth.   CURRENT MEDICATIONS:  1. Vitamin E.  2. Lecithin.  3. Aspirin 325 mg daily.  4. Multivitamin.  5. Lipitor 10 mg daily.  6. Flomax 0.4 mg daily.  7. Diovan 80 mg daily.  8. Citrucel.  9. Betimol 0.5% eye drop in both eyes.  10.Glucosamine.   PHYSICAL EXAMINATION:  VITAL SIGNS:  He is 224 pounds, temperature 98.2,  blood pressure 120/66, heart rate 82, oxygen saturation 99% on room air.  HEENT:  There is no sinus tenderness. There are no oral lesions.  HEART:  S1, S2.  CHEST:  Clear to auscultation.  ABDOMEN:  Obese, soft and nontender.  EXTREMITIES:  No edema.   IMPRESSION:  Severe obstructive sleep apnea, currently having  difficulties on his BiPAP machine. I have discussed with him the proper  fitting of his mask. I will also have him provided with a new mask. I  will also have him undergo a auto-BiPAP titration study for two weeks to  determine if he is on an adequate pressure setting. If he still is  having difficulty after this, we may need to consider having him return  to the sleep lab for an in-lab BiPAP titration study.   I will follow up with him in approximately 2 to 3  months.     Marc Helling, MD  Electronically Signed   VS/MedQ  DD: 06/24/2007  DT: 06/25/2007  Job #: [id number]

## 2011-01-17 NOTE — Assessment & Plan Note (Signed)
[facility]                             PULMONARY OFFICE NOTE   Marc Gutierrez, Marc Gutierrez                       MRN:          [id number]  DATE:11/09/2006                            DOB:          September 08, 1940    HISTORY OF PRESENT ILLNESS:  The patient is a 70 year old white male  patient of Dr. Georgann Housekeeper who is followed for obstructive sleep apnea  and chronic rhinitis.  The patient presents for a 40-month routine office  visit.  The patient is currently maintained on BiPAP [place] and has been  tolerating this very well.  The patient is currently maintained on  Nasonex and reports his nasal congestion is at baseline.  The patient  denies any chest pain, shortness of breath, daytime hypersomnolence, or  leg swelling.   PAST MEDICAL HISTORY:  Reviewed.   CURRENT MEDICATIONS:  Reviewed.   PHYSICAL EXAMINATION:  GENERAL:  The patient is a pleasant, obese male  in no acute distress.  VITAL SIGNS:  He is afebrile with stable vital signs.  HEENT:  Reveals some mild turbinate edema.  NECK:  Supple without cervical adenopathy.  No JVD.  LUNG SOUNDS:  Clear.  CARDIAC:  Regular rate.  ABDOMEN:  Soft and nontender.  EXTREMITIES:  Warm without edema.   IMPRESSION AND PLAN:  1. Obstructive sleep apnea, well compensated on BiPAP machine.  The      patient is to continue his present regimen, follow back up with Dr.      Jayme Cloud or Dr. Craige Cotta in 6 months or sooner if needed.  2. Chronic rhinitis with nasal septal deviation.  The patient will      continue on his present nasal regimen and follow up as scheduled.      Rubye Oaks, NP  Electronically Signed      Gailen Shelter, MD  Electronically Signed   TP/MedQ  DD: 11/12/2006  DT: 11/13/2006  Job #: [id number]

## 2011-01-17 NOTE — Op Note (Signed)
NAME:  Marc Gutierrez, Marc Gutierrez                ACCOUNT NO.:  [id number]   MEDICAL RECORD NO.:  [id number]          PATIENT TYPE:  AMB   LOCATION:  DSC                          FACILITY:  [facility]   PHYSICIAN:  Katy Fitch. Sypher, M.D. DATE OF BIRTH:  1941/02/18   DATE OF PROCEDURE:  07/08/2005  DATE OF DISCHARGE:                                 OPERATIVE REPORT   PREOP DIAGNOSIS:  1.  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  2.  Right carpal tunnel syndrome with preoperative electrodiagnostic studies      documenting significant median neuropathy across the right wrist.   POSTOP DIAGNOSIS:  1.  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  2.  Right carpal tunnel syndrome with preoperative electrodiagnostic studies      documenting significant median neuropathy across the right wrist.   OPERATION:  1.  Resection of right trapezium followed by synovectomy of CMC joint and      removal of multiple loose bodies.  2.  Distraction arthroplasty with placement of two 0.062-inch Kirschner      wires securing the index metacarpal and thumb metacarpal in a distracted      position.  3.  Free palmaris longus intermetacarpal ligament reconstruction.  4.  Through separate incision right carpal tunnel release.   OPERATING SURGEON:  Molly Maduro Sypher   ASSISTANT:  Molly Maduro Dasnoit PA-C.   ANESTHESIA:  General by LMA supervising anesthesiologist Dr. Noreene Larsson.   INDICATIONS:  Marc Gutierrez is a 70 year old right-hand dominant, retired  employee of the [employer] [place] who is status post reconstruction of his  left thumb CMC joint and left carpal tunnel release in 2002.   [name] had a similar arthritis involving his right thumb CMC joint and  right carpal tunnel syndrome documented by clinical examination and  electrodiagnostic studies.   Due to failed respond  to nonoperative measures, he is brought to the  operating at this time anticipating a similar reconstruction on the right  side. We anticipated trapezium excision distraction arthroplasty in the  manner of Meals as well as intermetacarpal ligament reconstruction utilizing  a free palmaris longus tendon graft and right carpal tunnel release.   Preoperatively the surgery is described in detail. The potential risks and  benefits of surgery were discussed and questions were invited, answered.   After informed consent Marc Gutierrez was brought to the operating room at this  time.   PROCEDURE:  Marc Gutierrez is brought to the operating room and placed in  supine position on the operating table.   Following induction of general anesthesia under direct supervision Dr.  Noreene Larsson by LMA technique, the right arm was prepped with Betadine soap  solution, sterilely draped. 1 gram of Ancef was administered as IV  prophylactic antibiotic.   The procedure commenced with exsanguination of right arm with an Esmarch  bandage and inflation of the arterial tourniquet on proximal brachium to 230  mmHg.   Procedure commenced with a short incision  in line of the ring finger in the  palm. The subcutaneous tissues were carefully divided to reveal the palmar  fascia.  This was split longitudinally to reveal the common sensory branch  of the median nerve and superficial palmar arch.   The distal margin of the transverse carpal ligament was isolated and a  Penfield 4 elevator was used to separate the median nerve from the  transverse carpal ligament. Transverse carpal ligament was released along  its ulnar border extending into the distal forearm with subcutaneous release  of the volar forearm fascia. This widely opened the carpal canal. No masses  or other predicaments were noted.   Bleeding points along the margin of the released ligament were  electrocauterized bipolar current followed by repair the skin with   intradermal 3-0 Prolene suture.   Attention was then directed to the right thumb CMC joint.   A curvilinear incision was fashioned exposing the base of the thumb  metacarpal trapezium and insertion of the abductor pollicis longus tendons.   Care was taken to gently dissect the radial sensory branches as well as the  palmar branches of the lateral antebrachial cutaneous nerve.   The interval between the extensor pollicis brevis and the abductor pollicis  longus tendon slips was gently dissected. There was noted to be a separate  compartment at the first dorsal compartment for the extensor pollicis brevis  that was released over a distance of 3 cm proximal under direct vision.   Care was taken to protect the radial sensory branches during this  dissection. Likewise the compartment for the abductor pollicis longus tendon  slips which revealed two well-defined slips was released a distance of 2.5  cm above its insertion point.   The trapezium was then exposed subperiosteally by use of the 64 beaver blade  with gentle retraction of the neurovascular structures including the radial  artery dorsally. After exposure of the trapezium on its dorsal radial and  palmar aspects, trapezium was fractured repeatedly with a small osteotome  and removed piecemeal with rongeurs. A complete synovectomy the [facility] joint  was accomplished followed by removal of multiple loose bodies between the  thumb metacarpal and the index metacarpal.   After assuring no debris remained behind, the wound was irrigated followed  by creation of two 3.5-mm drill holes. One through the base of thumb  metacarpal on a 45 degrees angle from 1 cm distal to the articular surface  and the second across the base of the index metacarpal distal to the  articular facette for the thumb metacarpal.   Care was taken to remove the beak osteophyte on the base of the metacarpal with a rongeur under direct vision followed by palpation to  assure adequate  removal.   The palmaris longus was harvested through a transverse incision at the  distal wrist flexion crease, cleared of muscular tissue and stored and a  saline-soaked sponge.   The palmaris longus was woven through the extensor carpi radialis brevis  dorsally and its two distal tails passed through the drill hole from the  dorsal ulnar aspect of the index metacarpal out the radial palmar aspect of  the drill hole. This was then brought up to the base of the thumb metacarpal  to create a new metacarpal ligament.   The thumb was placed in position of distraction and approximately 45 degrees  of mid palmar radial abduction and secured with two 0.062 inch Kirschner  wires into the index metacarpal, creating a triangular  construct.   AP lateral and multiple oblique C-arm images were obtained to assure  adequate purchase in the thumb metacarpal and index metacarpal as well as  proper distraction.   Thereafter the tendon graft was tensioned appropriately to create a proper  inner metacarpal ligament followed by weaving of the tails of the palmaris  longus into the abductor pollicis longus dorsal slip with a two pass  Pulvertaft weave and a single overhand knot. The weaves in the knot was  secured with mattress suture of 3-0 Ethibond followed by careful baring of  the knot in the cavity created by resection of the trapezium.   Care was taken to assure that the neurovascular structures were not impeded  prior to closure as well as assuring that there was no sign of impingement  of the Pulvertaft weave along the walls of the first dorsal compartment.   All wounds were then irrigated. Bleeding points were cauterized with bipolar  current followed by repair the skin with intradermal 3-0 Prolene suture and  Steri-Strips.   0.25% Marcaine without epinephrine was infiltrated along all wound margins  and into the region of the radial nerves for postoperative analgesia.    The tourniquet released for a total tourniquet time of 90 minutes. The thumb  and hand were immobilized in a voluminous gauze dressing with thumb spica  plaster splint protecting our construct.   There no apparent complications.   Mr. Marchena was transferred recovery room with stable vital signs.   He will be discharged to care of his wife with prescriptions for Dilaudid 2  milligrams 1 or 2 tablets p.o. q.4-6 hours p.r.n. pain also Motrin 600  milligrams one p.o. q.6 h. p.r.n. pain and Keflex 500 milligrams one p.o.  q.8 h. x4 days as prophylactic antibiotic.      Katy Fitch Sypher, M.D.  Electronically Signed     RVS/MEDQ  D:  07/08/2005  T:  07/08/2005  Job:  [id number]

## 2011-01-17 NOTE — Procedures (Signed)
NAME:  Marc Gutierrez, Marc Gutierrez NO.:  [id number]   MEDICAL RECORD NO.:  [id number]          PATIENT TYPE:  OUT   LOCATION:  SLEEP CENTER                 FACILITY:  [facility]   PHYSICIAN:  Marcelyn Bruins, M.D. [facility] DATE OF BIRTH:  Dec 13, 1940   DATE OF STUDY:  07/28/2005                              NOCTURNAL POLYSOMNOGRAM   REFERRING PHYSICIAN:  Danice Goltz, M.D. [facility].   DATE OF STUDY:  July 28, 2005.   INDICATION FOR STUDY:  Hypersomnia with sleep apnea.   EPWORTH SLEEPINESS SCORE:  7.   SLEEP ARCHITECTURE:  The patient had a total sleep time of 341 minutes with  decreased REM and never achieved slow wave sleep. Sleep onset latency was  normal as was REM onset. Sleep efficiency was 83%.   RESPIRATORY DATA:  The patient underwent C-PAP titration for previously  diagnosed obstructive sleep apnea. He was fitted with a large Ultra [place]age  full face mask and ultimately titrated to a final pressure of 14 cm with  excellent control of events and had increased REM during that time.   OXYGEN DATA:  There were no clinically significant O2 desaturations once the  patient reached optimal C-PAP.   CARDIAC DATA:  Rare PVC was noted.   MOVEMENT/PARASOMNIA:  The patient was found to have 95 leg jerks with very  little sleep disruption.   IMPRESSION:  Good control of previously diagnosed obstructive sleep apnea  with a C-PAP pressure of 14 cm.  A large Ultra Mirage full face mask was  used.           ______________________________  Marcelyn Bruins, M.D. [facility]  Diplomate, American Board of Sleep  Medicine     KC/MEDQ  D:  08/06/2005 15:48:57  T:  08/06/2005 21:43:55  Job:  [id number]

## 2011-01-17 NOTE — Op Note (Signed)
[place]  Patient:    Marc Gutierrez, Marc Gutierrez                      MRN: [id number] Proc. Date: 03/23/01 Attending:  Katy Fitch. Naaman Plummer., M.D.                           Operative Report  PREOPERATIVE DIAGNOSES: 1. Disabling left thumb carpometacarpal arthritis. 2. Chronic left carpal tunnel syndrome.  POSTOPERATIVE DIAGNOSES: 1. Disabling left thumb carpometacarpal arthritis. 2. Chronic left carpal tunnel syndrome.  PROCEDURES: 1. Excision of left trapezium and synovectomy and loose body excision from    left thumb carpometacarpal joint. 2. Fifty percent distally-based flexor carpi radialis tendon graft    intermetacarpal ligament reconstruction and suspensionplasty for    reconstruction of left thumb carpometacarpal articulation. 3. Through separate incision, left carpal tunnel release.  SURGEON:  Katy Fitch. Sypher, Montez Hageman., M.D.  ASSISTANT:  Jonni Sanger, P.A.  ANESTHESIA:  General orotracheal supervised by the anesthesiologist, Edwin Cap. Zoila Shutter, M.D.  INDICATIONS:  Marc Gutierrez is a 70 year old gentleman who has had progressive bilateral thumb arthritis.  He has had significant hand pain, impaired pinch, and impaired grasp due to his progressive arthritis.  In addition, he was noted to have signs of bilateral carpal tunnel syndrome.  Due to a failure to respond to nonoperative measures, he is brought to the operating room at this time for reconstruction of his right thumb CMC joint with primary indication pain relief, secondary indication improved function, including pinch strength and grip strength, and three, he is brought for release of the transverse carpal ligament to restore sensibility to his median-innervated fingers.  DESCRIPTION OF PROCEDURE:  Marc Gutierrez was brought to the operating room and placed in the supine position on the operating table.  Following induction of general anesthesia, the left arm was prepped with  Betadine soap and solution and sterilely draped.  Following exsanguination of the limb with Esmarch bandage, arterial tourniquet was placed to 220 mmHg.  Ancef 1 g was administered as an IV prophylactic antibiotic.  The procedure commenced with a short incision in the line of the ring finger of the palm.  Subcutaneous tissues were grasped and divided, revealing the palmar fascia.  This was split longitudinally to reveal the common sensory branch of the median nerve.  These were followed back to the transverse carpal ligament, which was carefully separated from the median nerve.  The transverse carpal ligament was released on its ulnar border with scissors, extending into the distal forearm.  This widely opened the carpal canal.  The volar forearm fascia was released with scissors subcutaneously.  The ulnar bursa was noted to be fibrotic.  Bleeding points were electrocauterized with bipolar current, followed by repair of the skin with intradermal 3-0 Prolene suture.  Attention was then directed to the base of the thumb.  A curvilinear incision was fashioned to expose the [facility] joint region.  The radial sensory branches and lateral antebrachial cutaneous branches were gently retracted.  The interval between the extensor pollicis brevis and abductor pollicis longus tendons was sharply incised, and the trapezium was exposed with the use of a 4 mm osteotome by subperiosteal dissection.  Multiple large osteophytes and loose bodies were encountered and removed.  The trapezium was piecemeal removed with a rongeur and a complete synovectomy of the [facility] joint accomplished.  The thenar muscles were carefully preserved.  Two drill holes  were created, one through the base of the index metacarpal extending from 1 cm distal to the articular surface to the central portion of the base of the thumb metacarpal, and a second through the base of the index metacarpal from radial to ulnar, beginning just  distal to the articular facet for the thumb metacarpal.  Both were enlarged to 4.5 mm with sequential use of drills.  The palmaris longus was absent; therefore, one-half of the flexor carpi radialis was harvested at the musculotendinous junction with a Carroll tendon-passing forceps, brought distally, and split through its insertion at the base of the index metacarpal.  This tendon was then passed dorsally through the index metacarpal drill hole, around the base of the index metacarpal, woven through the insertion of the extensor carpi radialis longus, brought deep to the radial artery, and back into the cavity created by trapezium excision.  This tendon was then brought through the base of the thumb metacarpal from proximal to distal dorsal, brought around the palmar surface of the thumb metacarpal, woven through the palmar aspect of the abductor pollicis longus, brought deep to the thumb metacarpal, and back up through the index metacarpal, creating a suspensionplasty tendon graft. Ultimately we suspended the thumb at an anatomic height in a dorsal position that properly tensioned the thenar muscles.  The tendon was then anchored with a Pulvertaft weave into the extensor carpi radialis longus, secured by three passes and multiple corner sutures of 3-0 Ethibond.  We checked to determine that the suspensionplasty was sound and found a very satisfactory construct.  The wounds were then thoroughly irrigated with sterile saline and repaired with intradermal 3-0 Prolene.  The tourniquet was released with immediate capillary refill to all fingers.  A voluminous gauze and acrylic fluff dressing was placed, followed by application of a thumb spica splint.  There were no apparent complications.  Marc Gutierrez will be admitted to the recovery care center for observation of his vital signs.  He will be placed on Ancef 1 g IV q.8h. as prophylactic antibiotic, and appropriate analgesics in the form  of IV-PCA morphine and oral Tylox.  He will be discharged in the morning with medications including Dilaudid 2 mg one or two tablets p.o. q.4-6h.  p.r.n. pain, 30 tablets without refill; Motrin 600 mg one p.o. q.6h. p.r.n. pain; and Keflex 500 mg one p.o. q.8h. x 4 days as a prophylactic antibiotic. DD:  03/23/01 TD:  03/24/01 Job: [id number]

## 2011-01-17 NOTE — Letter (Signed)
June 12, 2006      [facility]     RE:  Marc Gutierrez, Marc Gutierrez  MRN:  [id number]  /  DOB:  [birth year]   To whom it may concern:   This is with regards to the above captioned patient.  [name] is a 70-  year-old gentleman that I have followed here at [facility] since  2005.  At that time the patient was diagnosed with severe nasal obstruction,  septal deviation, and he had a split night study done which revealed  obstructive sleep apnea with 63 events in the first 61 minutes of sleep with  an RDI of 24.  The patient had an Epworth score of 24.  The patient was  terribly symptomatic during the day with excessive daytime sleepiness and  fatigue.   After diagnosing sleep apnea, we did prescribe CPAP therapy which the  patient was not able to tolerate.  He has a deviated nasal septum and uvulo-  palatal, and tongue base hypertrophy as well as anterior nasal vestibulitis.  The patient has been seen by ENT and they have deemed that he would not be  an optimal surgical candidate due to inability to guarantee that his  symptoms would be totally alleviated with surgery.  The patient since that  time has been tried on several CPAP and bi-level machines which he always  had difficulty tolerating because of pressure issues.  His records indicate  that he has had at least 4-5 machine changes and numerous headgear changes  again with the same difficulties tolerating the equipment.  Most recently  the patient was switched to a BiPAP Malibu.  This has been the first  equipment that the patient has been able to tolerate without difficulty.  He  is finally getting a full night's sleep and his daytime sleepiness and  fatigue have markedly improved.  In addition, the patient's sensation of  fatigue and dyspnea has also improved.   For this reason stated above and because the patient has been unable to  tolerate any other equipment for his moderate to severe obstructive apnea, I  have  recommended that he stay on the BiPAP Malibu.   I hope this helps in the management of this patient's care.  Please do not  hesitate to contact our office should further information be necessary.    Sincerely,      C. Danice Goltz, MD    CLG/MedQ  /  Job #:  [id number]  DD:  06/12/2006 / DT:  06/14/2006   CC:    [facility]

## 2011-01-17 NOTE — Assessment & Plan Note (Signed)
[facility]                               PULMONARY OFFICE NOTE   TALBERT, TREMBATH                       MRN:          [id number]  DATE:05/25/2006                            DOB:          May 01, 1941    This is a 70 year old gentleman who follows here for obstructive sleep  apnea.  He also has severe chronic nasal obstruction for which he follows  with Dr. Osborn Coho.  Patient presents today for followup.  He has been  using BiPAP Malibu, and has been able to tolerate this very well.  This is  an autotitration device.  Currently, he denies any difficulties with regards  to his nasal congestion.  He has been able to sleep with his BiPAP for at  least 8 hours per night.   CURRENT MEDICATIONS:  As noted on the intact sheet.  These have been  reviewed and are accurate.   PHYSICAL EXAMINATION:  VITAL SIGNS:  Noted.  Oxygen saturation is 97% on  room air.  GENERAL:  This is a well-developed, obese gentleman who is in no acute  distress.  HEENT:  Reveals nasal turbinate hypertrophy bilaterally.  He also has 75%  left-sided obstruction of the left naris due to septal deviation.  Airway is  class 3.  He has tonsillar hypertrophy.  NECK:  Supple.  No adenopathy noted.  No JVD.  LUNGS:  Clear to auscultation bilaterally.  EXTREMITIES:  No edema noted.   IMPRESSION:  1. Obstructive sleep apnea.  The patient is currently well compensated on      his new BiPAP device.  2. Chronic rhinitis and nasal septal deviation.   PLAN:  1. Patient to continue BiPAP as is.  2. Continue nasal hygiene with nasal saline spray.  3. Followup will be in 6 months' time.  He is to contact us prior to that      time should any problems arise.                                   Gailen Shelter, MD   CLG/MedQ  DD:  05/25/2006  DT:  05/27/2006  Job #:  [id number]

## 2011-01-17 NOTE — Op Note (Signed)
NAME:  Marc Gutierrez, Marc Gutierrez                ACCOUNT NO.:  [id number]   MEDICAL RECORD NO.:  [id number]          PATIENT TYPE:  AMB   LOCATION:  [facility]                         FACILITY:  [facility]   PHYSICIAN:  Maretta Bees. 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   PROCEDURE:  Cystoscopy and holmium laser incision of bladder neck.   SURGEON:  Maretta Bees. Vonita Moss, M.D.   ANESTHESIA:  General.   INDICATIONS:  This 70 year old white male has had bladder outlet obstructive  symptoms and has been on Flomax with mild benefit.  Cystoscopy in the office  revealed a small prostatic urethra with a high, somewhat tight bladder neck.  Prostate ultrasound showed the prostate to be only 17.6 cc in size.  He was  counseled about laser incision of the bladder neck.  Also, PSA was 0.15.   PROCEDURE:  The patient was brought to the operating room and placed in the  lithotomy position.  External genitalia were prepped and draped in the usual  fashion.  He was cystoscoped, and the anterior urethra was normal.  The  prostatic urethra actually looked like he had some previous resection, but I  could visualize almost better with the rigid scope than I could with the  flexible scope.  He still had a high-riding bladder neck.  The rest of the  bladder itself was unremarkable.  I used the holmium laser and did a bladder  neck incision at 6 o'clock, which seemed to spring open the bladder neck  more favorably.  There was essentially no blood loss, good hemostasis, and  the bladder was partially emptied before removal of the scope, but I left  some irrigation fluid in for him to be able to void in the recovery room to  see if he can go home without a  catheter today.  He tolerated the procedure  well.      LJP/MEDQ  D:  08/07/2004  T:  08/07/2004  Job:  [id number]

## 2011-04-28 ENCOUNTER — Ambulatory Visit (HOSPITAL_COMMUNITY)
Admission: RE | Admit: 2011-04-28 | Discharge: 2011-04-28 | Disposition: A | Discharge status: Home / Self Care | Payer: Medicare Other | Source: Ambulatory Visit | Attending: Orthopedic Surgery | Admitting: Orthopedic Surgery

## 2011-04-28 ENCOUNTER — Other Ambulatory Visit: Payer: Self-pay | Admitting: Orthopedic Surgery

## 2011-04-28 ENCOUNTER — Other Ambulatory Visit (HOSPITAL_COMMUNITY): Payer: Self-pay | Admitting: Orthopedic Surgery

## 2011-04-28 ENCOUNTER — Encounter (HOSPITAL_COMMUNITY): Payer: Medicare Other

## 2011-04-28 DIAGNOSIS — I10 Essential (primary) hypertension: Secondary | ICD-10-CM | POA: Insufficient documentation

## 2011-04-28 DIAGNOSIS — Z01818 Encounter for other preprocedural examination: Secondary | ICD-10-CM

## 2011-04-28 DIAGNOSIS — Z01812 Encounter for preprocedural laboratory examination: Secondary | ICD-10-CM | POA: Insufficient documentation

## 2011-04-28 LAB — COMPREHENSIVE METABOLIC PANEL
ALT: 18 U/L (ref 0–53)
AST: 19 U/L (ref 0–37)
Albumin: 3.9 g/dL (ref 3.5–5.2)
Alkaline Phosphatase: 46 U/L (ref 39–117)
BUN: 19 mg/dL (ref 6–23)
CO2: 29 mEq/L (ref 19–32)
Calcium: 9.9 mg/dL (ref 8.4–10.5)
Chloride: 102 mEq/L (ref 96–112)
Creatinine, Ser: 1.36 mg/dL — ABNORMAL HIGH (ref 0.50–1.35)
GFR calc Af Amer: >60 mL/min (ref >60)
GFR calc non Af Amer: 52 mL/min — ABNORMAL LOW (ref >60)
Glucose, Bld: 229 mg/dL — ABNORMAL HIGH (ref 70–99)
Potassium: 3.9 mEq/L (ref 3.5–5.1)
Sodium: 139 mEq/L (ref 135–145)
Total Bilirubin: 0.4 mg/dL (ref 0.3–1.2)
Total Protein: 7.8 g/dL (ref 6.0–8.3)

## 2011-04-28 LAB — URINALYSIS, ROUTINE W REFLEX MICROSCOPIC
Bilirubin Urine: NEGATIVE
Glucose, UA: 250 mg/dL — AB
Hgb urine dipstick: NEGATIVE
Ketones, ur: NEGATIVE mg/dL
Leukocytes, UA: NEGATIVE
Nitrite: NEGATIVE
Protein, ur: NEGATIVE mg/dL
Specific Gravity, Urine: 1.025 (ref 1.005–1.030)
Urobilinogen, UA: 1 mg/dL (ref 0.0–1.0)
pH: 7 (ref 5.0–8.0)

## 2011-04-28 LAB — CBC
HCT: 38.9 % — ABNORMAL LOW (ref 39.0–52.0)
Hemoglobin: 11.9 g/dL — ABNORMAL LOW (ref 13.0–17.0)
MCH: 29 pg (ref 26.0–34.0)
MCHC: 30.6 g/dL (ref 30.0–36.0)
MCV: 94.9 fL (ref 78.0–100.0)
Platelets: 235 10*3/uL (ref 150–400)
RBC: 4.1 MIL/uL — ABNORMAL LOW (ref 4.22–5.81)
RDW: 14.4 % (ref 11.5–15.5)
WBC: 4.9 10*3/uL (ref 4.0–10.5)

## 2011-04-28 LAB — SURGICAL PCR SCREEN
MRSA, PCR: NEGATIVE
Staphylococcus aureus: NEGATIVE

## 2011-04-28 LAB — PROTIME-INR: Prothrombin Time: 14 seconds (ref 11.6–15.2)

## 2011-05-01 ENCOUNTER — Ambulatory Visit (HOSPITAL_COMMUNITY)
Admission: RE | Admit: 2011-05-01 | Discharge: 2011-05-02 | Disposition: A | Discharge status: Home / Self Care | Payer: Medicare Other | Source: Ambulatory Visit | Attending: Orthopedic Surgery | Admitting: Orthopedic Surgery

## 2011-05-01 DIAGNOSIS — M25469 Effusion, unspecified knee: Secondary | ICD-10-CM | POA: Insufficient documentation

## 2011-05-01 DIAGNOSIS — E119 Type 2 diabetes mellitus without complications: Secondary | ICD-10-CM | POA: Insufficient documentation

## 2011-05-01 DIAGNOSIS — Z96659 Presence of unspecified artificial knee joint: Secondary | ICD-10-CM | POA: Insufficient documentation

## 2011-05-01 DIAGNOSIS — I10 Essential (primary) hypertension: Secondary | ICD-10-CM | POA: Insufficient documentation

## 2011-05-01 DIAGNOSIS — Z79899 Other long term (current) drug therapy: Secondary | ICD-10-CM | POA: Insufficient documentation

## 2011-05-01 DIAGNOSIS — T84099A Other mechanical complication of unspecified internal joint prosthesis, initial encounter: Secondary | ICD-10-CM | POA: Insufficient documentation

## 2011-05-01 DIAGNOSIS — I4510 Unspecified right bundle-branch block: Secondary | ICD-10-CM | POA: Insufficient documentation

## 2011-05-01 DIAGNOSIS — Y831 Surgical operation with implant of artificial internal device as the cause of abnormal reaction of the patient, or of later complication, without mention of misadventure at the time of the procedure: Secondary | ICD-10-CM | POA: Insufficient documentation

## 2011-05-01 DIAGNOSIS — G4733 Obstructive sleep apnea (adult) (pediatric): Secondary | ICD-10-CM | POA: Insufficient documentation

## 2011-05-01 DIAGNOSIS — M8708 Idiopathic aseptic necrosis of bone, other site: Secondary | ICD-10-CM | POA: Insufficient documentation

## 2011-05-01 LAB — GLUCOSE, CAPILLARY

## 2011-05-04 LAB — WOUND CULTURE: Culture: NO GROWTH

## 2011-05-06 LAB — ANAEROBIC CULTURE

## 2011-05-07 NOTE — Op Note (Signed)
NAME:  Marc Gutierrez, Marc Gutierrez NO.:  [id number]  MEDICAL RECORD NO.:  [id number]  LOCATION:  1612                         FACILITY:  [facility]  PHYSICIAN:  Ollen Gross, M.D.    DATE OF BIRTH:  05/19/1941  DATE OF PROCEDURE:  05/01/2011 DATE OF DISCHARGE:                              OPERATIVE REPORT   PREOPERATIVE DIAGNOSIS:  Recurrent effusion left knee with questionable septic arthritis.  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  PROCEDURE:  Left knee arthrotomy with patellectomy.  SURGEON:  Ollen Gross, M.D.  ASSISTANT:  Avel Peace PA-C  ANESTHESIA:  General.  ESTIMATED BLOOD LOSS:  Minimal.  DRAINS:  Hemovac x1.  TOURNIQUET TIME:  21 minutes at 300 mmHg.  COMPLICATIONS:  None.  CONDITION:  Stable to Recovery.  BRIEF CLINICAL NOTE:  Marc Gutierrez is a 70 year old male underwent left total knee arthroplasty in 2011 with unremarkable postoperative courseand remarkable recovery.  He is actually doing very well and then approximately a month and month and half ago he presented with painful swelling in the left knee.  Earlier in August, he had an aspiration of the joint showing bloody fluid.  The gram stain and cultures were all negative.  White blood cell count was only mildly elevated, but not concerning for infection.  His radiographs did not show any obvious abnormalities.  He presented again 2 weeks later for followup with refusion again and the joint was again aspirated with negative studies. I was concerned with recurrent effusions 1 year postop that he may have a low grade infection.  We decided to admit him today for arthrotomy I and D and possible polyethylene exchange.  He presents now for that procedure.  PROCEDURE IN DETAIL:  After successful administration of general anesthetic, a tourniquet placed on the left thigh and left lower extremity prepped and draped in the usual sterile fashion.   Extremities wrapped in Esmarch, knee flexed, tourniquet inflated to 300 mmHg. Midline incision was made with 10 blade through subcutaneous tissue to the level of the extensor mechanism.  Fresh blade is used to make a small arthrotomy.  The fluids is encountered and not always bloody fluid.  No evidence of any purulence or any debris in the fluid.  This was sent for gram stain C and S, aerobic and anaerobic cultures.  I completed the arthrotomy and great surprise found that the patellar component was floating freely in the knee joint.  The component is easily removed.  No evidence of any significant fibrinous debris about the joint and no evidence of any purulence.  There is synovectomy.  I was unable to evert the patella and noted that the patella bone was essentially crumbling.  He has osteonecrosis of the patella and there was a very thin residual shell of patella left.  That shell actually followed the contour of the femoral component thus was articulated directly on the femoral component.  Given that the bone was essentially devitalized, we went ahead and performed a patellectomy.  This extensor mechanism is completely intact.  We then irrigated the joint with 3 liters of saline with pulsatile lavage.  The joint close to range  of motion and he has good intact stability to varus-valgus anterior- posterior stressing throughout full range of motion.  I plicated the extensor mechanism to strengthen the area where the bone was removed. We then closed the arthrotomy over Hemovac drain with interrupted #1 PDS.  Flexion against gravity is 135 degrees.  There is no laxity on range of motion.  Tourniquet was then  released for a total time of 21 minutes.  Subcutaneous was then closed with interrupted 2-0 Vicryl and subcuticular running 4-0 Monocryl.  Incisions cleaned and dried and Steri-Strips and bulky sterile dressing were applied.  He is then awakened and transported to recovery in stable  condition.     Ollen Gross, M.D.     FA/MEDQ  D:  05/01/2011  T:  05/02/2011  Job:  [id number]  Electronically Signed by Ollen Gross M.D. on 05/07/2011 10:04:22 AM

## 2011-09-02 HISTORY — PX: OTHER SURGICAL HISTORY: SHX169

## 2012-05-07 ENCOUNTER — Other Ambulatory Visit: Payer: Self-pay | Admitting: Orthopedic Surgery

## 2012-05-07 MED ORDER — DEXAMETHASONE SODIUM PHOSPHATE 10 MG/ML IJ SOLN: 10.0000 mg | Freq: Once | INTRAMUSCULAR | Status: DC

## 2012-05-07 NOTE — Progress Notes (Signed)
Preoperative surgical orders have been place into the Epic hospital system for Marc Gutierrez on 05/07/2012, 9:42 AM  by Patrica Duel for surgery on 05/19/2012.  Preop Knee Scope orders including IV Tylenol and IV Decadron as long as there are no contraindications to the above medications. Avel Peace, PA-C

## 2012-05-17 ENCOUNTER — Encounter (HOSPITAL_COMMUNITY)
Admission: RE | Admit: 2012-05-17 | Discharge: 2012-05-17 | Disposition: A | Discharge status: Home / Self Care | Payer: Medicare Other | Source: Ambulatory Visit | Attending: Orthopedic Surgery | Admitting: Orthopedic Surgery

## 2012-05-17 ENCOUNTER — Encounter (HOSPITAL_COMMUNITY): Payer: Self-pay

## 2012-05-17 ENCOUNTER — Ambulatory Visit (HOSPITAL_COMMUNITY)
Admission: RE | Admit: 2012-05-17 | Discharge: 2012-05-17 | Disposition: A | Discharge status: Home / Self Care | Payer: Medicare Other | Source: Ambulatory Visit | Attending: Orthopedic Surgery | Admitting: Orthopedic Surgery

## 2012-05-17 ENCOUNTER — Encounter (HOSPITAL_COMMUNITY): Payer: Self-pay | Admitting: Pharmacy Technician

## 2012-05-17 DIAGNOSIS — G4730 Sleep apnea, unspecified: Secondary | ICD-10-CM | POA: Insufficient documentation

## 2012-05-17 DIAGNOSIS — C801 Malignant (primary) neoplasm, unspecified: Secondary | ICD-10-CM

## 2012-05-17 DIAGNOSIS — J4489 Other specified chronic obstructive pulmonary disease: Secondary | ICD-10-CM | POA: Insufficient documentation

## 2012-05-17 DIAGNOSIS — J449 Chronic obstructive pulmonary disease, unspecified: Secondary | ICD-10-CM | POA: Insufficient documentation

## 2012-05-17 DIAGNOSIS — H409 Unspecified glaucoma: Secondary | ICD-10-CM

## 2012-05-17 DIAGNOSIS — E119 Type 2 diabetes mellitus without complications: Secondary | ICD-10-CM | POA: Insufficient documentation

## 2012-05-17 DIAGNOSIS — Z01812 Encounter for preprocedural laboratory examination: Secondary | ICD-10-CM | POA: Insufficient documentation

## 2012-05-17 DIAGNOSIS — M6580 Other synovitis and tenosynovitis, unspecified site: Secondary | ICD-10-CM | POA: Insufficient documentation

## 2012-05-17 DIAGNOSIS — I10 Essential (primary) hypertension: Secondary | ICD-10-CM | POA: Insufficient documentation

## 2012-05-17 HISTORY — DX: Unspecified glaucoma: H40.9

## 2012-05-17 HISTORY — DX: Sleep apnea, unspecified: G47.30

## 2012-05-17 HISTORY — DX: Malignant (primary) neoplasm, unspecified: C80.1

## 2012-05-17 HISTORY — DX: Essential (primary) hypertension: I10

## 2012-05-17 HISTORY — DX: Chronic obstructive pulmonary disease, unspecified: J44.9

## 2012-05-17 LAB — CBC
Platelets: 207 10*3/uL (ref 150–400)
RDW: 14 % (ref 11.5–15.5)
WBC: 4.1 10*3/uL (ref 4.0–10.5)

## 2012-05-17 LAB — BASIC METABOLIC PANEL
Chloride: 101 mEq/L (ref 96–112)
GFR calc Af Amer: 72 mL/min — ABNORMAL LOW (ref >90)
Potassium: 4.1 mEq/L (ref 3.5–5.1)
Sodium: 139 mEq/L (ref 135–145)

## 2012-05-17 LAB — SURGICAL PCR SCREEN: Staphylococcus aureus: NEGATIVE

## 2012-05-17 NOTE — Pre-Procedure Instructions (Signed)
05-17-12 EKG/ CXR done today. 05-17-12 1205 pm CBC,BMP results faxed to Dr. Lequita Halt office for view. W. Kennon Portela

## 2012-05-17 NOTE — Patient Instructions (Addendum)
20 KYLA TIMM  05/17/2012   Your procedure is scheduled on:  9-18 -2013  Report to [facility] at       0745 AM.  Call this number if you have problems the morning of surgery: [phone]  Or Presurgical Testing [phone](Jamyron Redd)   Remember:   Do not eat food:After Midnight.   Bring Cpap mask and tubing, not machine.   Take these medicines the morning of surgery with A SIP OF WATER: Pantoprazole, Fenofibrate. Bring Symbicort inhaler.   Do not wear jewelry, make-up or nail polish.  Do not wear lotions, powders, or perfumes. You may wear deodorant.  Do not shave 48 hours prior to surgery.(face and neck okay, no shaving of legs)  Do not bring valuables to the hospital.  Contacts, dentures or bridgework may not be worn into surgery.  Leave suitcase in the car. After surgery it may be brought to your room.  For patients admitted to the hospital, checkout time is 11:00 AM the day of discharge.   Patients discharged the day of surgery will not be allowed to drive home. Must have responsible person with you x 24 hours once discharged.  Name and phone number of your driver: Cye Einstein, spouse [phone] cell  Special Instructions: CHG Shower Use Special Wash: 1/2 bottle night before surgery and 1/2 bottle morning of surgery.(avoid face and genitals)   Please read over the following fact sheets that you were given: MRSA Information.

## 2012-05-18 MED ORDER — SODIUM CHLORIDE 0.9 % IV SOLN: INTRAVENOUS | Status: DC

## 2012-05-18 MED ORDER — DEXTROSE 5 % IV SOLN
3.0000 g | INTRAVENOUS | Status: AC
  Administered 2012-05-19: 2 g via INTRAVENOUS
  Filled 2012-05-18: qty 3000

## 2012-05-19 ENCOUNTER — Encounter (HOSPITAL_COMMUNITY): Payer: Self-pay | Admitting: Anesthesiology

## 2012-05-19 ENCOUNTER — Encounter (HOSPITAL_COMMUNITY)
Admission: RE | Disposition: A | Discharge status: Home / Self Care | Payer: Self-pay | Source: Ambulatory Visit | Attending: Orthopedic Surgery

## 2012-05-19 ENCOUNTER — Ambulatory Visit (HOSPITAL_COMMUNITY): Payer: Medicare Other | Admitting: Anesthesiology

## 2012-05-19 ENCOUNTER — Ambulatory Visit (HOSPITAL_COMMUNITY)
Admission: RE | Admit: 2012-05-19 | Discharge: 2012-05-19 | Disposition: A | Discharge status: Home / Self Care | Payer: Medicare Other | Source: Ambulatory Visit | Attending: Orthopedic Surgery | Admitting: Orthopedic Surgery

## 2012-05-19 ENCOUNTER — Encounter (HOSPITAL_COMMUNITY): Payer: Self-pay | Admitting: *Deleted

## 2012-05-19 DIAGNOSIS — M659 Synovitis and tenosynovitis, unspecified: Secondary | ICD-10-CM | POA: Diagnosis present

## 2012-05-19 DIAGNOSIS — M21869 Other specified acquired deformities of unspecified lower leg: Secondary | ICD-10-CM | POA: Insufficient documentation

## 2012-05-19 DIAGNOSIS — R7309 Other abnormal glucose: Secondary | ICD-10-CM | POA: Insufficient documentation

## 2012-05-19 DIAGNOSIS — Z79899 Other long term (current) drug therapy: Secondary | ICD-10-CM | POA: Insufficient documentation

## 2012-05-19 DIAGNOSIS — J4489 Other specified chronic obstructive pulmonary disease: Secondary | ICD-10-CM | POA: Insufficient documentation

## 2012-05-19 DIAGNOSIS — Z96659 Presence of unspecified artificial knee joint: Secondary | ICD-10-CM | POA: Insufficient documentation

## 2012-05-19 DIAGNOSIS — M6580 Other synovitis and tenosynovitis, unspecified site: Secondary | ICD-10-CM | POA: Insufficient documentation

## 2012-05-19 DIAGNOSIS — R29898 Other symptoms and signs involving the musculoskeletal system: Secondary | ICD-10-CM | POA: Insufficient documentation

## 2012-05-19 DIAGNOSIS — J449 Chronic obstructive pulmonary disease, unspecified: Secondary | ICD-10-CM | POA: Insufficient documentation

## 2012-05-19 DIAGNOSIS — M25519 Pain in unspecified shoulder: Secondary | ICD-10-CM | POA: Insufficient documentation

## 2012-05-19 DIAGNOSIS — I10 Essential (primary) hypertension: Secondary | ICD-10-CM | POA: Insufficient documentation

## 2012-05-19 DIAGNOSIS — G4730 Sleep apnea, unspecified: Secondary | ICD-10-CM | POA: Insufficient documentation

## 2012-05-19 HISTORY — PX: KNEE ARTHROSCOPY: SHX127

## 2012-05-19 LAB — GLUCOSE, CAPILLARY
Glucose-Capillary: 184 mg/dL — ABNORMAL HIGH (ref 70–99)
Glucose-Capillary: 152 mg/dL — ABNORMAL HIGH (ref 70–99)
Glucose-Capillary: 173 mg/dL — ABNORMAL HIGH (ref 70–99)

## 2012-05-19 SURGERY — ARTHROSCOPY, KNEE
Anesthesia: General | Site: Abdomen | Laterality: Left | Wound class: Clean

## 2012-05-19 MED ORDER — CEFAZOLIN SODIUM-DEXTROSE 2-3 GM-% IV SOLR
INTRAVENOUS | Status: AC
  Filled 2012-05-19: qty 50

## 2012-05-19 MED ORDER — ACETAMINOPHEN 10 MG/ML IV SOLN: 1000.0000 mg | Freq: Once | INTRAVENOUS | Status: DC | PRN

## 2012-05-19 MED ORDER — EPHEDRINE SULFATE 50 MG/ML IJ SOLN
INTRAMUSCULAR | Status: DC | PRN
  Administered 2012-05-19: 5 mg via INTRAVENOUS
  Administered 2012-05-19: 10 mg via INTRAVENOUS
  Administered 2012-05-19: 5 mg via INTRAVENOUS

## 2012-05-19 MED ORDER — BUPIVACAINE-EPINEPHRINE 0.25% -1:200000 IJ SOLN
INTRAMUSCULAR | Status: DC | PRN
  Administered 2012-05-19: 20 mL

## 2012-05-19 MED ORDER — OXYCODONE-ACETAMINOPHEN 5-325 MG PO TABS: 1.0000 | ORAL_TABLET | ORAL | Status: DC | PRN

## 2012-05-19 MED ORDER — OXYCODONE HCL 5 MG PO TABS: 5.0000 mg | ORAL_TABLET | Freq: Once | ORAL | Status: DC | PRN

## 2012-05-19 MED ORDER — LACTATED RINGERS IR SOLN
Status: DC | PRN
  Administered 2012-05-19: 3000 mL

## 2012-05-19 MED ORDER — LIDOCAINE HCL (CARDIAC) 20 MG/ML IV SOLN
INTRAVENOUS | Status: DC | PRN
  Administered 2012-05-19: 100 mg via INTRAVENOUS

## 2012-05-19 MED ORDER — CHLORHEXIDINE GLUCONATE 4 % EX LIQD
60.0000 mL | Freq: Once | CUTANEOUS | Status: DC
  Filled 2012-05-19: qty 60

## 2012-05-19 MED ORDER — MEPERIDINE HCL 50 MG/ML IJ SOLN: 6.2500 mg | INTRAMUSCULAR | Status: DC | PRN

## 2012-05-19 MED ORDER — OXYCODONE-ACETAMINOPHEN 5-325 MG PO TABS
1.0000 | ORAL_TABLET | ORAL | Status: DC | PRN
  Administered 2012-05-19: 1 via ORAL
  Filled 2012-05-19: qty 1

## 2012-05-19 MED ORDER — LACTATED RINGERS IV SOLN
INTRAVENOUS | Status: DC | PRN
  Administered 2012-05-19 (×2): via INTRAVENOUS

## 2012-05-19 MED ORDER — ONDANSETRON HCL 4 MG/2ML IJ SOLN
INTRAMUSCULAR | Status: DC | PRN
  Administered 2012-05-19: 4 mg via INTRAVENOUS

## 2012-05-19 MED ORDER — ACETAMINOPHEN 10 MG/ML IV SOLN
1000.0000 mg | Freq: Once | INTRAVENOUS | Status: AC
  Administered 2012-05-19: 1000 mg via INTRAVENOUS

## 2012-05-19 MED ORDER — METHOCARBAMOL 500 MG PO TABS: 500.0000 mg | ORAL_TABLET | Freq: Four times a day (QID) | ORAL | Status: DC

## 2012-05-19 MED ORDER — HYDROMORPHONE HCL PF 1 MG/ML IJ SOLN: 0.2500 mg | INTRAMUSCULAR | Status: DC | PRN

## 2012-05-19 MED ORDER — PROMETHAZINE HCL 25 MG/ML IJ SOLN: 6.2500 mg | INTRAMUSCULAR | Status: DC | PRN

## 2012-05-19 MED ORDER — PROPOFOL 10 MG/ML IV BOLUS
INTRAVENOUS | Status: DC | PRN
  Administered 2012-05-19: 200 mg via INTRAVENOUS

## 2012-05-19 MED ORDER — ACETAMINOPHEN 10 MG/ML IV SOLN
INTRAVENOUS | Status: AC
  Filled 2012-05-19: qty 100

## 2012-05-19 MED ORDER — BUPIVACAINE-EPINEPHRINE PF 0.25-1:200000 % IJ SOLN
INTRAMUSCULAR | Status: AC
  Filled 2012-05-19: qty 30

## 2012-05-19 MED ORDER — FENTANYL CITRATE 0.05 MG/ML IJ SOLN
INTRAMUSCULAR | Status: DC | PRN
  Administered 2012-05-19: 25 ug via INTRAVENOUS
  Administered 2012-05-19: 50 ug via INTRAVENOUS
  Administered 2012-05-19: 25 ug via INTRAVENOUS
  Administered 2012-05-19: 50 ug via INTRAVENOUS

## 2012-05-19 MED ORDER — OXYCODONE HCL 5 MG/5ML PO SOLN
5.0000 mg | Freq: Once | ORAL | Status: DC | PRN
  Filled 2012-05-19: qty 5

## 2012-05-19 SURGICAL SUPPLY — 24 items
BLADE 4.2CUDA (BLADE) ×2 IMPLANT
CLOTH BEACON ORANGE TIMEOUT ST (SAFETY) ×2 IMPLANT
CUFF TOURN SGL QUICK 34 (TOURNIQUET CUFF) ×2
CUFF TRNQT CYL 34X4X40X1 (TOURNIQUET CUFF) ×1 IMPLANT
DRAPE U-SHAPE 47X51 STRL (DRAPES) ×2 IMPLANT
DRSG EMULSION OIL 3X3 NADH (GAUZE/BANDAGES/DRESSINGS) ×2 IMPLANT
DRSG PAD ABDOMINAL 8X10 ST (GAUZE/BANDAGES/DRESSINGS) ×2 IMPLANT
DURAPREP 26ML APPLICATOR (WOUND CARE) ×2 IMPLANT
GLOVE BIO SURGEON STRL SZ7.5 (GLOVE) ×3 IMPLANT
GLOVE BIO SURGEON STRL SZ8 (GLOVE) ×2 IMPLANT
GLOVE BIOGEL PI IND STRL 8 (GLOVE) ×1 IMPLANT
GLOVE BIOGEL PI INDICATOR 8 (GLOVE) ×1
GOWN STRL NON-REIN LRG LVL3 (GOWN DISPOSABLE) ×3 IMPLANT
MANIFOLD NEPTUNE II (INSTRUMENTS) ×4 IMPLANT
PACK ARTHROSCOPY WL (CUSTOM PROCEDURE TRAY) ×2 IMPLANT
PACK ICE MAXI GEL EZY WRAP (MISCELLANEOUS) ×6 IMPLANT
PADDING CAST COTTON 6X4 STRL (CAST SUPPLIES) ×2 IMPLANT
POSITIONER SURGICAL ARM (MISCELLANEOUS) ×2 IMPLANT
SET ARTHROSCOPY TUBING (MISCELLANEOUS) ×2
SET ARTHROSCOPY TUBING LN (MISCELLANEOUS) ×1 IMPLANT
SUT ETHILON 4 0 PS 2 18 (SUTURE) ×2 IMPLANT
TOWEL OR 17X26 10 PK STRL BLUE (TOWEL DISPOSABLE) ×2 IMPLANT
WAND 90 DEG TURBOVAC W/CORD (SURGICAL WAND) ×2 IMPLANT
WRAP KNEE MAXI GEL POST OP (GAUZE/BANDAGES/DRESSINGS) ×4 IMPLANT

## 2012-05-19 NOTE — Brief Op Note (Signed)
05/19/2012  10:47 AM  PATIENT:  Marc Gutierrez  71 y.o. male  PRE-OPERATIVE DIAGNOSIS:  synovitis of left knee  POST-OPERATIVE DIAGNOSIS:  synovitis of left knee  PROCEDURE:  Procedure(s) (LRB) with comments: ARTHROSCOPY KNEE (Left) - with Synovectomy  SURGEON:  Surgeon(s) and Role:    * Loanne Drilling, MD - Primary  PHYSICIAN ASSISTANT:   ASSISTANTS: none   ANESTHESIA:   general  EBL:  Total I/O In: 1000 [I.V.:1000] Out: -   DICTATION: .Other Dictation: Dictation Number [id number]  PLAN OF CARE: Discharge to home after PACU  PATIENT DISPOSITION:  PACU - hemodynamically stable.

## 2012-05-19 NOTE — Interval H&P Note (Signed)
History and Physical Interval Note:  05/19/2012 9:57 AM  Bascom Levels  has presented today for surgery, with the diagnosis of synovitis of left knee  The various methods of treatment have been discussed with the patient and family. After consideration of risks, benefits and other options for treatment, the patient has consented to  Procedure(s) (LRB) with comments: ARTHROSCOPY KNEE (Left) - with Synovectomy as a surgical intervention .  The patient's history has been reviewed, patient examined, no change in status, stable for surgery.  I have reviewed the patient's chart and labs.  Questions were answered to the patient's satisfaction.     Loanne Drilling

## 2012-05-19 NOTE — Transfer of Care (Signed)
Immediate Anesthesia Transfer of Care Note  Patient: Marc Gutierrez  Procedure(s) Performed: Procedure(s) (LRB) with comments: ARTHROSCOPY KNEE (Left) - with Synovectomy  Patient Location: PACU  Anesthesia Type: General  Level of Consciousness: sedated  Airway & Oxygen Therapy: Patient Spontanous Breathing and Patient connected to face mask oxygen  Post-op Assessment: Report given to PACU RN and Post -op Vital signs reviewed and stable  Post vital signs: Reviewed and stable  Complications: No apparent anesthesia complications

## 2012-05-19 NOTE — Preoperative (Signed)
Beta Blockers   Reason not to administer Beta Blockers:Not Applicable 

## 2012-05-19 NOTE — Anesthesia Preprocedure Evaluation (Addendum)
Anesthesia Evaluation  Patient identified by MRN, date of birth, ID band Patient awake    Reviewed: Allergy & Precautions, H&P , NPO status , Patient's Chart, lab work & pertinent test results  Airway Mallampati: I TM Distance: >3 FB Neck ROM: Full    Dental  (+) Edentulous Upper, Edentulous Lower and Dental Advisory Given   Pulmonary sleep apnea , COPD COPD inhaler,  breath sounds clear to auscultation  Pulmonary exam normal       Cardiovascular hypertension, Pt. on medications Rhythm:Regular Rate:Normal     Neuro/Psych negative neurological ROS  negative psych ROS   GI/Hepatic negative GI ROS, Neg liver ROS,   Endo/Other  diabetes, Type 2  Renal/GU Renal disease     Musculoskeletal negative musculoskeletal ROS (+)   Abdominal   Peds  Hematology negative hematology ROS (+)   Anesthesia Other Findings   Reproductive/Obstetrics                          Anesthesia Physical Anesthesia Plan  ASA: III  Anesthesia Plan: General   Post-op Pain Management:    Induction: Intravenous  Airway Management Planned: LMA  Additional Equipment:   Intra-op Plan:   Post-operative Plan: Extubation in OR  Informed Consent: I have reviewed the patients History and Physical, chart, labs and discussed the procedure including the risks, benefits and alternatives for the proposed anesthesia with the patient or authorized representative who has indicated his/her understanding and acceptance.   Dental advisory given  Plan Discussed with: CRNA and Surgeon  Anesthesia Plan Comments:         Anesthesia Quick Evaluation

## 2012-05-19 NOTE — Progress Notes (Signed)
Pt has c-pap mask  And inhalers in room. Wife aware these may be needed

## 2012-05-19 NOTE — Anesthesia Postprocedure Evaluation (Signed)
Anesthesia Post Note  Patient: Marc Gutierrez  Procedure(s) Performed: Procedure(s) (LRB): ARTHROSCOPY KNEE (Left)  Anesthesia type: General  Patient location: PACU  Post pain: Pain level controlled  Post assessment: Post-op Vital signs reviewed  Last Vitals: BP 130/64  Pulse 71  Temp 36.7 C  Resp 18  SpO2 100%  Post vital signs: Reviewed  Level of consciousness: sedated  Complications: No apparent anesthesia complications

## 2012-05-19 NOTE — H&P (Signed)
CC- Marc Gutierrez is a 71 y.o. male who presents with left knee pain.  HPI- . Knee Pain: Patient presents with knee pain involving the  left knee. Onset of the symptoms was several months ago. Inciting event: none known. Current symptoms include diffuse pain. Pain is aggravated by going up and down stairs and inactivity.  Patient has had prior knee problems. Evaluation to date: plain films: normal. Treatment to date: brace which is not very effective and analgesics which are ineffective. He had a left TKA and did well initially then developed osteonecrosis of his patella and required patellectomy. He has had persistent pain and has painful crepitus anteriorly and medially. He presents for arthroscopy with synovectomy.  Past Medical History  Diagnosis Date  . Diabetes mellitus 05-17-12    "Borderrline"-watches, no meds  . Hypertension   . Sleep apnea 05-17-12    Sleep Apnea-uses cpap  . Glaucoma 05-17-12    tx. surgery and eye drops  . COPD (chronic obstructive pulmonary disease)   . Chronic kidney disease 05-17-12    hx. past kidney stone  . Cancer 05-17-12    skin cancer face in past, hx. Rosacea(face,chest.    Past Surgical History  Procedure Date  . Cataract extraction, bilateral 05-17-12    bilateral  . Eye surgery 05-17-12    Glaucoma surgery  . Joint replacement     bil. Knee(left x2)  . Orchiectomy 05-17-12    right testicle removed(deteriation)  . Vasectomy 05-17-12    hx.  . Nasal cautery 05-17-12    due to nose bleed  . Hernia repair     x3    Prior to Admission medications   Medication Sig Start Date End Date Taking? Authorizing Provider  aspirin 81 MG chewable tablet Chew 81 mg by mouth every other day.    Historical Provider, MD  budesonide-formoterol (SYMBICORT) 160-4.5 MCG/ACT inhaler Inhale 2 puffs into the lungs 2 (two) times daily. Uses once daily    Historical Provider, MD  fenofibrate (TRICOR) 145 MG tablet Take 145 mg by mouth every morning.    Historical  Provider, MD  hydrocortisone valerate cream (WESTCORT) 0.2 % Apply 1 application topically 2 (two) times daily as needed. As needed to face    Historical Provider, MD  latanoprost (XALATAN) 0.005 % ophthalmic solution Place 1 drop into both eyes at bedtime.    Historical Provider, MD  lisinopril (PRINIVIL,ZESTRIL) 20 MG tablet Take 20 mg by mouth every evening.    Historical Provider, MD  Multiple Vitamins-Minerals (CENTRUM SILVER PO) Take 1 tablet by mouth daily.    Historical Provider, MD  OVER THE COUNTER MEDICATION Place 1 drop into both eyes daily as needed. When outside in the sun    Historical Provider, MD  pantoprazole (PROTONIX) 40 MG tablet Take 40 mg by mouth daily.    Historical Provider, MD  simvastatin (ZOCOR) 40 MG tablet Take 40 mg by mouth every evening.    Historical Provider, MD  tiotropium (SPIRIVA) 18 MCG inhalation capsule Place 18 mcg into inhaler and inhale at bedtime.     Historical Provider, MD   KNEE EXAM antalgic gait, collateral ligaments intact, normal ipsilateral hip exam, ROM 10-120 active; 0-120 passive. No warmth or erythema. No effusion. Crepitus on ROM  Physical Examination: General appearance - alert, well appearing, and in no distress Mental status - alert, oriented to person, place, and time Chest - clear to auscultation, no wheezes, rales or rhonchi, symmetric air entry Heart - normal rate,  regular rhythm, normal S1, S2, no murmurs, rubs, clicks or gallops Abdomen - soft, nontender, nondistended, no masses or organomegaly Neurological - alert, oriented, normal speech, no focal findings or movement disorder noted   Asessment/Plan--- Left knee hypertrophic synovitis- - Plan left knee arthroscopy withsynovectomy. Procedure risks and potential comps discussed with patient who elects to proceed. Goals are decreased pain and increased function with a high likelihood of achieving both

## 2012-05-20 ENCOUNTER — Encounter (HOSPITAL_COMMUNITY): Payer: Self-pay | Admitting: Orthopedic Surgery

## 2012-05-20 NOTE — Op Note (Signed)
NAME:  Marc Gutierrez, RAUSCH NO.:  [id number]  MEDICAL RECORD NO.:  [id number]  LOCATION:  [facility]                         FACILITY:  [facility]  PHYSICIAN:  Ollen Gross, M.D.    DATE OF BIRTH:  [birth year]  DATE OF PROCEDURE:  05/19/2012 DATE OF DISCHARGE:  05/19/2012                              OPERATIVE REPORT   PREOPERATIVE DIAGNOSIS:  Hypertrophic synovitis, left knee.  POSTOPERATIVE DIAGNOSIS:  Hypertrophic synovitis, left knee.  PROCEDURE:  Left knee arthroscopy with synovectomy.  SURGEON:  Ollen Gross, M.D.  ASSISTANT:  None.  ANESTHESIA:  General.  ESTIMATED BLOOD LOSS:  Minimal.  DRAINS:  None.  COMPLICATIONS:  None.  CONDITION:  Stable to recovery.  BRIEF CLINICAL NOTE:  [name] is a 71 year old male with complex history in regards to his left knee.  He had a total knee arthroplasty performed, initially did very well, and then developed osteonecrosis of his patella requiring patellectomy.  He has functionally done well, but has persistent significant pain.  Has had crepitus on range of motion, it was felt he may have hypertrophic synovitis.  He presents now for arthroscopy with synovectomy.  PROCEDURE IN DETAIL:  After successful administration of general anesthetic, a tourniquet was placed high on his left thigh and left lower extremity prepped and draped in usual sterile fashion.  Standard superomedial and inferolateral incisions were made.  Inflow cannula passed, superomedial camera passed inferolateral.  Arthroscopic visualization proceeds.  Fair amount of hypertrophic tissue in the suprapatellar area.  Also the lateral gutter has a lot of hypertrophic tissue.  Using combination of the ArthroCare and shaver through a superolateral created portal and debrided this tissue back to normal- appearing tissue.  ArthroCare was used to achieve meticulous hemostasis. Once completed, joints again inspected and no other areas of hypertrophic tissue were  identified.  We then removed the lateral portals, closed with interrupted 4-0 nylon.  20 mL of 0.25% Marcaine with epinephrine was injected through the inflow cannula, after that was removed that portal closed with nylon.  Bulky sterile dressing was applied and he was awakened and transported to recovery in stable condition.     Ollen Gross, M.D.     FA/MEDQ  D:  05/19/2012  T:  05/20/2012  Job:  [id number]

## 2013-01-03 ENCOUNTER — Ambulatory Visit (INDEPENDENT_AMBULATORY_CARE_PROVIDER_SITE_OTHER): Payer: Medicare Other | Admitting: Surgery

## 2013-01-03 ENCOUNTER — Encounter (INDEPENDENT_AMBULATORY_CARE_PROVIDER_SITE_OTHER): Payer: Self-pay | Admitting: Surgery

## 2013-01-03 VITALS — BP 142/84 | HR 80 | Temp 98.6°F | Resp 16 | Ht 71.0 in | Wt 207.0 lb

## 2013-01-03 DIAGNOSIS — Z860100 Personal history of colon polyps, unspecified: Secondary | ICD-10-CM | POA: Insufficient documentation

## 2013-01-03 DIAGNOSIS — K648 Other hemorrhoids: Secondary | ICD-10-CM

## 2013-01-03 DIAGNOSIS — K5909 Other constipation: Secondary | ICD-10-CM

## 2013-01-03 DIAGNOSIS — K5900 Constipation, unspecified: Secondary | ICD-10-CM

## 2013-01-03 HISTORY — PX: HEMORRHOID BANDING: SHX5850

## 2013-01-03 NOTE — Progress Notes (Signed)
Subjective:     Patient ID: Marc Gutierrez, male   DOB: 12/19/1940, 72 y.o.   MRN: [id number]  HPI  Marc Gutierrez  06-01-1941 [id number]  Patient Care Team: Dina Rich, MD as PCP - General (Unknown Physician Specialty)  This patient is a 72 y.o.male who presents today for surgical evaluation at the request of Dr. Matthias Hughs.   Reason for visit: Anal pain.  Question of fissure vs. Hemorrhoids.  Pleasant elderly gentleman struggling with chronic constipation.  He has had intermittent anal pain for many years.  Presumed the hemorrhoids.  Chronic constipation with a bowel movement every three days.  Convinced to start MiraLAX.  Still having intermittent pains.  Now more constant.  Comes today with his wife.  Gets rectal bleeding as well.  Moderate volume at first and now mainly with wiping.  It is a burning discomfort before during and after bowel movements.  No drainage.  Thinks he had hemorrhoid surgery in the distant past.  Never had to have an abscess drained  No personal nor family history of GI/colon cancer, +POLYPS for self.  No inflammatory bowel disease, irritable bowel syndrome, allergy such as Celiac Sprue, dietary/dairy problems, colitis, ulcers nor gastritis.  No recent sick contacts/gastroenteritis.  No travel outside the country.  No changes in diet.    Patient Active Problem List   Diagnosis Date Noted  . Synovitis of knee 05/19/2012  . EPISTAXIS 11/19/2009  . CHRONIC RHINITIS 06/06/2008  . HYPERCHOLESTEROLEMIA 07/16/2007  . OBSTRUCTIVE SLEEP APNEA 07/16/2007  . HYPERTENSION 07/16/2007  . DEVIATED NASAL SEPTUM 07/16/2007    Past Medical History  Diagnosis Date  . Diabetes mellitus 05-17-12    "Borderrline"-watches, no meds  . Hypertension   . Sleep apnea 05-17-12    Sleep Apnea-uses cpap  . Glaucoma 05-17-12    tx. surgery and eye drops  . COPD (chronic obstructive pulmonary disease)   . Chronic kidney disease 05-17-12    hx. past kidney stone  . Cancer 05-17-12   skin cancer face in past, hx. Rosacea(face,chest.    Past Surgical History  Procedure Laterality Date  . Cataract extraction, bilateral  05-17-12    bilateral  . Eye surgery  05-17-12    Glaucoma surgery  . Joint replacement      bil. Knee(left x2)  . Orchiectomy  05-17-12    right testicle removed(deteriation)  . Vasectomy  05-17-12    hx.  . Nasal cautery  05-17-12    due to nose bleed  . Hernia repair      x3  . Knee arthroscopy  05/19/2012    Procedure: ARTHROSCOPY KNEE;  Surgeon: Loanne Drilling, MD;  Location: [facility];  Service: Orthopedics;  Laterality: Left;  with Synovectomy    History   Social History  . Marital Status: Married    Spouse Name: N/A    Number of Children: N/A  . Years of Education: N/A   Occupational History  . Not on file.   Social History Main Topics  . Smoking status: Former Smoker    Quit date: 05/18/1979  . Smokeless tobacco: Not on file  . Alcohol Use: Yes     Comment: rare-beer  . Drug Use: No  . Sexually Active: Not Currently   Other Topics Concern  . Not on file   Social History Narrative  . No narrative on file    No family history on file.  Current Outpatient Prescriptions  Medication Sig Dispense Refill  . aspirin 81  MG chewable tablet Chew 81 mg by mouth every other day.      . budesonide-formoterol (SYMBICORT) 160-4.5 MCG/ACT inhaler Inhale 2 puffs into the lungs 2 (two) times daily. Uses once daily      . fenofibrate (TRICOR) 145 MG tablet Take 145 mg by mouth every morning.      . hydrocortisone acetate 0.5 % cream Apply topically 2 (two) times daily.      . hydrocortisone valerate cream (WESTCORT) 0.2 % Apply 1 application topically 2 (two) times daily as needed. As needed to face      . lisinopril (PRINIVIL,ZESTRIL) 20 MG tablet Take 20 mg by mouth every evening.      . methocarbamol (ROBAXIN) 500 MG tablet Take 1 tablet (500 mg total) by mouth 4 (four) times daily.  30 tablet  1  . Multiple Vitamins-Minerals (CENTRUM  SILVER PO) Take 1 tablet by mouth daily.      Marland Kitchen OVER THE COUNTER MEDICATION Place 1 drop into both eyes daily as needed. When outside in the sun      . oxyCODONE-acetaminophen (ROXICET) 5-325 MG per tablet Take 1-2 tablets by mouth every 4 (four) hours as needed for pain.  40 tablet  0  . pantoprazole (PROTONIX) 40 MG tablet Take 40 mg by mouth daily.      . Polyethylene Glycol 3350 (MIRALAX PO) Take 1 application by mouth daily.      . pramoxine-hydrocortisone (ANALPRAM HC) cream Apply topically 3 (three) times daily.      . simvastatin (ZOCOR) 40 MG tablet Take 40 mg by mouth every evening.      . tiotropium (SPIRIVA) 18 MCG inhalation capsule Place 18 mcg into inhaler and inhale at bedtime.       Marland Kitchen latanoprost (XALATAN) 0.005 % ophthalmic solution Place 1 drop into both eyes at bedtime.       No current facility-administered medications for this visit.     No Known Allergies  BP 142/84  Pulse 80  Temp(Src) 98.6 F (37 C) (Temporal)  Resp 16  Ht 5\' 11"  (1.803 m)  Wt 207 lb (93.895 kg)  BMI 28.88 kg/m2  No results found.   Review of Systems  Constitutional: Negative for fever, chills and diaphoresis.  HENT: Positive for hearing loss. Negative for nosebleeds, sore throat, facial swelling, mouth sores, trouble swallowing and ear discharge.   Eyes: Negative for photophobia, discharge and visual disturbance.  Respiratory: Negative for choking, chest tightness, shortness of breath and stridor.   Cardiovascular: Negative for chest pain and palpitations.  Gastrointestinal: Positive for abdominal pain, anal bleeding and rectal pain. Negative for nausea, vomiting, diarrhea, constipation and abdominal distention.  Endocrine: Negative for cold intolerance and heat intolerance.  Genitourinary: Negative for dysuria, urgency, difficulty urinating and testicular pain.  Musculoskeletal: Negative for myalgias, back pain, arthralgias and gait problem.  Skin: Negative for color change, pallor,  rash and wound.  Allergic/Immunologic: Negative for environmental allergies and food allergies.  Neurological: Negative for dizziness, speech difficulty, weakness, numbness and headaches.  Hematological: Negative for adenopathy. Does not bruise/bleed easily.  Psychiatric/Behavioral: Negative for hallucinations, confusion and agitation.       Objective:   Physical Exam  Constitutional: He is oriented to person, place, and time. He appears well-developed and well-nourished. No distress.  HENT:  Head: Normocephalic.  Mouth/Throat: Oropharynx is clear and moist. No oropharyngeal exudate.  Eyes: Conjunctivae and EOM are normal. Pupils are equal, round, and reactive to light. No scleral icterus.  Neck: Normal range  of motion. Neck supple. No tracheal deviation present.  Cardiovascular: Normal rate, regular rhythm and intact distal pulses.   Pulmonary/Chest: Effort normal and breath sounds normal. No respiratory distress.  Abdominal: Soft. He exhibits no distension. There is no tenderness. Hernia confirmed negative in the right inguinal area and confirmed negative in the left inguinal area.  Genitourinary:  Exam done with assistance of male Medical Assistant in the room.  Perianal skin clean with good hygiene.  No pruritis.  No external skin tags / hemorrhoids of significance.  Small ant midline ant skin fold/tage.  No pilonidal disease.  No fissure.  No abscess/fistula.    Tolerates digital and anoscopic rectal exam.  Normal sphincter tone.  No rectal masses.  Hemorrhoidal piles enlarged R post > L Lat > R. ant   Musculoskeletal: Normal range of motion. He exhibits no tenderness.  Lymphadenopathy:    He has no cervical adenopathy.       Right: No inguinal adenopathy present.       Left: No inguinal adenopathy present.  Neurological: He is alert and oriented to person, place, and time. No cranial nerve deficit. He exhibits normal muscle tone. Coordination normal.  Skin: Skin is warm and  dry. No rash noted. He is not diaphoretic. No erythema. No pallor.  Psychiatric: He has a normal mood and affect. His behavior is normal. Judgment and thought content normal.       Assessment:     Internal hemorrhoids with bleeding and intermittent prolapse in the setting of chronic constipation.  Persistent symptoms was constipation under better control.     Plan:     I banded two of the three hemorrhoidal piles:  The anatomy & physiology of the anorectal region was discussed.  The pathophysiology of hemorrhoids and differential diagnosis was discussed.  Natural history progression  was discussed.   I stressed the importance of a bowel regimen to have daily soft bowel movements to minimize progression of disease.     The patient's symptoms are not adequately controlled.  Therefore, I recommended banding to treat the hemorrhoids.  I went over the technique, risks, benefits, and alternatives.   Goals of post-operative recovery were discussed as well.  Questions were answered.  The patient expressed understanding & wished to proceed.  The patient was positioned in the lateral decubitus position.  Perianal & rectal examination was done.  Had some moderate stool in the vault that had to be cleared out.  Using anoscopy, I ligated the hemorrhoids above the dentate line with banding - R post & L lat piles.  .  The patient tolerated the procedure well.  Educational handouts further explaining the pathology, treatment options, and bowel regimen were given as well.    I strongly recommend he continue the MiraLAX.  Goal is one soft bowel movement a day.

## 2013-01-03 NOTE — Patient Instructions (Signed)
HEMORRHOIDS  The rectum is the last foot of your colon, and it naturally stretches to hold stool.  Hemorrhoidal piles are natural clusters of blood vessels that help the rectum and anal canal stretch to hold stool and allow bowel movements to eliminate feces.   Hemorrhoids are abnormally swollen blood vessels in the rectum.  Too much pressure in the rectum causes hemorrhoids by forcing blood to stretch and bulge the walls of the veins, sometimes even rupturing them.  Hemorrhoids can become like varicose veins you might see on a person's legs.  Most people will develop a flare of hemorrhoids in their lifetime.  When bulging hemorrhoidal veins are irritated, they can swell, burn, itch, cause pain, and bleed.  Most flares will calm down gradually own within a few weeks.  However, once hemorrhoids are created, they are difficult to get rid of completely and tend to flare more easily than the first flare.   Fortunately, good habits and simple medical treatment usually control hemorrhoids well, and surgery is needed only in severe cases. Types of Hemorrhoids:  Internal hemorrhoids usually don't initially hurt or itch; they are deep inside the rectum and usually have no sensation. If they begin to push out (prolapse), pain and burning can occur.  However, internal hemorrhoids can bleed.  Anal bleeding should not be ignored since bleeding could come from a dangerous source like colorectal cancer, so persistent rectal bleeding should be investigated by a doctor, sometimes with a colonoscopy.  External hemorrhoids cause most of the symptoms - pain, burning, and itching. Nonirritated hemorrhoids can look like small skin tags coming out of the anus.   Thrombosed hemorrhoids can form when a hemorrhoid blood vessel bursts and causes the hemorrhoid to suddenly swell.  A purple blood clot can form in it and become an excruciatingly painful lump at the anus. Because of these unpleasant symptoms, immediate incision and  drainage by a surgeon at an office visit can provide much relief of the pain.    PREVENTION Avoiding the most frequent causes listed below will prevent most cases of hemorrhoids: Constipation Hard stools Diarrhea  Constant sitting  Straining with bowel movements Sitting on the toilet for a long time  Severe coughing  episodes Pregnancy / Childbirth  Heavy Lifting  Sometimes avoiding the above triggers is difficult:  How can you avoid sitting all day if you have a seated job? Also, we try to avoid coughing and diarrhea, but sometimes it's beyond your control.  Still, there are some practical hints to help: Keep the anal and genital area clean.  Moistened tissues such as flushable wet wipes are less irritating than toilet paper.  Using irrigating showers or bottle irrigation washing gently cleans this sensitive area.   Avoid dry toilet paper when cleaning after bowel movements.  Marland Kitchen Keep the anal and genital area dry.  Lightly pat the rectal area dry.  Avoid rubbing.  Talcum or baby powders can help GET YOUR STOOLS SOFT.   This is the most important way to prevent irritated hemorrhoids.  Hard stools are like sandpaper to the anorectal canal and will cause more problems.  The goal: ONE SOFT BOWEL MOVEMENT A DAY!  BMs from every other day to 3 times a day is a tolerable range Treat coughing, diarrhea and constipation early since irritated hemorrhoids may soon follow.  If your main job activity is seated, always stand or walk during your breaks. Make it a point to stand and walk at least 5 minutes every hour  and try to shift frequently in your chair to avoid direct rectal pressure.  Always exhale as you strain or lift. Don't hold your breath.  Do not delay or try to prevent a bowel movement when the urge is present. Exercise regularly (walking or jogging 60 minutes a day) to stimulate the bowels to move. No reading or other activity while on the toilet. If bowel movements take longer than 5 minutes,  you are too constipated. AVOID CONSTIPATION Drink plenty of liquids (1 1/2 to 2 quarts of water and other fluids a day unless fluid restricted for another medical condition). Liquids that contain caffeine (coffee a, tea, soft drinks) can be dehydrating and should be avoided until constipation is controlled. Consider minimizing milk, as dairy products may be constipating. Eat plenty of fiber (30g a day ideal, more if needed).  Fiber is the undigested part of plant food that passes into the colon, acting as "natures broom" to encourage bowel motility and movement.  Fiber can absorb and hold large amounts of water. This results in a larger, bulkier stool, which is soft and easier to pass.  Eating foods high in fiber - 12 servings - such as  Vegetables: Root (potatoes, carrots, turnips), Leafy green (lettuce, salad greens, celery, spinach), High residue (cabbage, broccoli, etc.) Fruit: Fresh, Dried (prunes, apricots, cherries), Stewed (applesauce)  Whole grain breads, pasta, whole wheat Bran cereals, muffins, etc. Consider adding supplemental bulking fiber which retains large volumes of water: Psyllium ground seeds --available as Metamucil, Konsyl, Effersyllium, Per Diem Fiber, or the less expensive generic forms.  Citrucel  (methylcellulose wood fiber) . FiberCon (Polycarbophil) Polyethylene Glycol - and "artificial" fiber commonly called Miralax or Glycolax.  It is helpful for people with gassy or bloated feelings with regular fiber Flax Seed - a less gassy natural fiber  Laxatives can be useful for a short period if constipation is severe Osmotics (Milk of Magnesia, Fleets Phospho-Soda, Magnesium Citrate)  Stimulants (Senokot,   Castor Oil,  Dulcolax, Ex-Lax)    Laxatives are not a good long-term solution as it can stress the bowels and cause too much mineral loss and dehydration.   Avoid taking laxatives for more than 7 days in a row.  AVOID DIARRHEA Switch to liquids and simpler foods for a few  days to avoid stressing your intestines further. Avoid dairy products (especially milk & ice cream) for a short time.  The intestines often can lose the ability to digest lactose when stressed. Avoid foods that cause gassiness or bloating.  Typical foods include beans and other legumes, cabbage, broccoli, and dairy foods.  Every person has some sensitivity to other foods, so listen to your body and avoid those foods that trigger problems for you. Adding fiber (Citrucel, Metamucil, FiberCon, Flax seed, Miralax) gradually can help thicken stools by absorbing excess fluid and retrain the intestines to act more normally.  Slowly increase the dose over a few weeks.  Too much fiber too soon can backfire and cause cramping & bloating. Probiotics (such as active yogurt, Align, etc) may help repopulate the intestines and colon with normal bacteria and calm down a sensitive digestive tract.  Most studies show it to be of mild help, though, and such products can be costly. Medicines: Bismuth subsalicylate (ex. Kayopectate, Pepto Bismol) every 30 minutes for up to 6 doses can help control diarrhea.  Avoid if pregnant. Loperamide (Immodium) can slow down diarrhea.  Start with two tablets (48m total) first and then try one tablet every 6 hours.  Avoid if you are having fevers or severe pain.  If you are not better or start feeling worse, stop all medicines and call your doctor for advice Call your doctor if you are getting worse or not better.  Sometimes further testing (cultures, endoscopy, X-ray studies, bloodwork, etc) may be needed to help diagnose and treat the cause of the diarrhea. TREATMENT OF HEMORRHOID FLARE If these preventive measures fail, you must take action right away! Hemorrhoids are one condition that can be mild in the morning and become intolerable by nightfall. Most hemorrhoidal flares take several weeks to calm down.  These suggestions can help: Warm soaks.  This helps more than any topical  medication.  Use up to 8 times a day.  Usually sitz baths or sitting in a warm bathtub helps.  Sitting on moist warm towels are helpful.  Switching to ice packs/cool compresses can be helpful Normalize your bowels.  Extremes of diarrhea or constipation will make hemorrhoids worse.  One soft bowel movement a day is the goal.  Fiber can help get your bowels regular Wet wipes instead of toilet paper Pain control with a NSAID such as ibuprofen (Advil) or naproxen (Aleve) or acetaminophen (Tylenol) around the clock.  Narcotics are constipating and should be minimized if possible Topical creams contain steroids (bydrocortisone) or local anesthetic (xylocaine) can help make pain and itching more tolerable.   EVALUATION If hemorrhoids are still causing problems, you could benefit by an evaluation by a surgeon.  The surgeon will obtain a history and examine you.  If hemorrhoids are diagnosed, some therapies can be offered in the office, usually with an anoscope into the less sensitive area of the rectum: -injection of hemorrhoids (sclerotherapy) can scar the blood vessels of the swollen/enlarged hemorrhoids to help shrink them down to a more normal size -rubber banding of the enlarged hemorrhoids to help shrink them down to a more normal size -drainage of the blood clot causing a thrombosed hemorrhoid,  to relieve the severe pain   While 90% of the time such problems from hemorrhoids can be managed without preceding to surgery, sometimes the hemorrhoids require a operation to control the problem (uncontrolled bleeding, prolapse, pain, etc.).   This involves being placed under general anesthesia where the surgeon can confirm the diagnosis and remove, suture, or staple the hemorrhoid(s).  Your surgeon can help you treat the problem appropriately.    GETTING TO GOOD BOWEL HEALTH. Irregular bowel habits such as constipation and diarrhea can lead to many problems over time.  Having one soft bowel movement a day is  the most important way to prevent further problems.  The anorectal canal is designed to handle stretching and feces to safely manage our ability to get rid of solid waste (feces, poop, stool) out of our body.  BUT, hard constipated stools can act like ripping concrete bricks and diarrhea can be a burning fire to this very sensitive area of our body, causing inflamed hemorrhoids, anal fissures, increasing risk is perirectal abscesses, abdominal pain/bloating, an making irritable bowel worse.     The goal: ONE SOFT BOWEL MOVEMENT A DAY!  To have soft, regular bowel movements:    Drink at least 8 tall glasses of water a day.     Take plenty of fiber.  Fiber is the undigested part of plant food that passes into the colon, acting s "natures broom" to encourage bowel motility and movement.  Fiber can absorb and hold large amounts of water. This results in a   larger, bulkier stool, which is soft and easier to pass. Work gradually over several weeks up to 6 servings a day of fiber (25g a day even more if needed) in the form of: o Vegetables -- Root (potatoes, carrots, turnips), leafy green (lettuce, salad greens, celery, spinach), or cooked high residue (cabbage, broccoli, etc) o Fruit -- Fresh (unpeeled skin & pulp), Dried (prunes, apricots, cherries, etc ),  or stewed ( applesauce)  o Whole grain breads, pasta, etc (whole wheat)  o Bran cereals    Bulking Agents -- This type of water-retaining fiber generally is easily obtained each day by one of the following:  o Psyllium bran -- The psyllium plant is remarkable because its ground seeds can retain so much water. This product is available as Metamucil, Konsyl, Effersyllium, Per Diem Fiber, or the less expensive generic preparation in drug and health food stores. Although labeled a laxative, it really is not a laxative.  o Methylcellulose -- This is another fiber derived from wood which also retains water. It is available as Citrucel. o Polyethylene Glycol - and  "artificial" fiber commonly called Miralax or Glycolax.  It is helpful for people with gassy or bloated feelings with regular fiber o Flax Seed - a less gassy fiber than psyllium   No reading or other relaxing activity while on the toilet. If bowel movements take longer than 5 minutes, you are too constipated   AVOID CONSTIPATION.  High fiber and water intake usually takes care of this.  Sometimes a laxative is needed to stimulate more frequent bowel movements, but    Laxatives are not a good long-term solution as it can wear the colon out. o Osmotics (Milk of Magnesia, Fleets phosphosoda, Magnesium citrate, MiraLax, GoLytely) are safer than  o Stimulants (Senokot, Castor Oil, Dulcolax, Ex Lax)    o Do not take laxatives for more than 7days in a row.    IF SEVERELY CONSTIPATED, try a Bowel Retraining Program: o Do not use laxatives.  o Eat a diet high in roughage, such as bran cereals and leafy vegetables.  o Drink six (6) ounces of prune or apricot juice each morning.  o Eat two (2) large servings of stewed fruit each day.  o Take one (1) heaping tablespoon of a psyllium-based bulking agent twice a day. Use sugar-free sweetener when possible to avoid excessive calories.  o Eat a normal breakfast.  o Set aside 15 minutes after breakfast to sit on the toilet, but do not strain to have a bowel movement.  o If you do not have a bowel movement by the third day, use an enema and repeat the above steps.    Controlling diarrhea o Switch to liquids and simpler foods for a few days to avoid stressing your intestines further. o Avoid dairy products (especially milk & ice cream) for a short time.  The intestines often can lose the ability to digest lactose when stressed. o Avoid foods that cause gassiness or bloating.  Typical foods include beans and other legumes, cabbage, broccoli, and dairy foods.  Every person has some sensitivity to other foods, so listen to our body and avoid those foods that trigger  problems for you. o Adding fiber (Citrucel, Metamucil, psyllium, Miralax) gradually can help thicken stools by absorbing excess fluid and retrain the intestines to act more normally.  Slowly increase the dose over a few weeks.  Too much fiber too soon can backfire and cause cramping & bloating. o Probiotics (such as active yogurt,   Align, etc) may help repopulate the intestines and colon with normal bacteria and calm down a sensitive digestive tract.  Most studies show it to be of mild help, though, and such products can be costly. o Medicines:   Bismuth subsalicylate (ex. Kayopectate, Pepto Bismol) every 30 minutes for up to 6 doses can help control diarrhea.  Avoid if pregnant.   Loperamide (Immodium) can slow down diarrhea.  Start with two tablets (4mg  total) first and then try one tablet every 6 hours.  Avoid if you are having fevers or severe pain.  If you are not better or start feeling worse, stop all medicines and call your doctor for advice o Call your doctor if you are getting worse or not better.  Sometimes further testing (cultures, endoscopy, X-ray studies, bloodwork, etc) may be needed to help diagnose and treat the cause of the diarrhea. o

## 2014-02-21 ENCOUNTER — Other Ambulatory Visit: Payer: Self-pay | Admitting: Urology

## 2014-03-17 ENCOUNTER — Encounter (HOSPITAL_COMMUNITY): Payer: Self-pay | Admitting: Pharmacy Technician

## 2014-03-17 ENCOUNTER — Other Ambulatory Visit (HOSPITAL_COMMUNITY): Payer: Self-pay | Admitting: Urology

## 2014-03-17 NOTE — Progress Notes (Signed)
Chest CT 6/15, stress test 4/15, TEE and EKG 6/15, cath 6/15, sleep study 10/12 with LOV Dr Loura Back 7/15, LOV with clearance Dr Garlon Hatchet ALL ON CHART

## 2014-03-20 ENCOUNTER — Encounter (HOSPITAL_COMMUNITY): Payer: Self-pay

## 2014-03-20 ENCOUNTER — Ambulatory Visit (HOSPITAL_COMMUNITY)
Admission: RE | Admit: 2014-03-20 | Discharge: 2014-03-20 | Disposition: A | Discharge status: Home / Self Care | Payer: Medicare Other | Source: Ambulatory Visit | Attending: Anesthesiology | Admitting: Anesthesiology

## 2014-03-20 ENCOUNTER — Encounter (HOSPITAL_COMMUNITY)
Admission: RE | Admit: 2014-03-20 | Discharge: 2014-03-20 | Disposition: A | Discharge status: Home / Self Care | Payer: Medicare Other | Source: Ambulatory Visit | Attending: Urology | Admitting: Urology

## 2014-03-20 DIAGNOSIS — N320 Bladder-neck obstruction: Secondary | ICD-10-CM | POA: Insufficient documentation

## 2014-03-20 DIAGNOSIS — R911 Solitary pulmonary nodule: Secondary | ICD-10-CM | POA: Insufficient documentation

## 2014-03-20 DIAGNOSIS — Z01812 Encounter for preprocedural laboratory examination: Secondary | ICD-10-CM | POA: Diagnosis not present

## 2014-03-20 DIAGNOSIS — I517 Cardiomegaly: Secondary | ICD-10-CM | POA: Insufficient documentation

## 2014-03-20 DIAGNOSIS — Z01818 Encounter for other preprocedural examination: Secondary | ICD-10-CM | POA: Insufficient documentation

## 2014-03-20 DIAGNOSIS — M4000 Postural kyphosis, site unspecified: Secondary | ICD-10-CM | POA: Insufficient documentation

## 2014-03-20 HISTORY — DX: Cardiac arrhythmia, unspecified: I49.9

## 2014-03-20 HISTORY — DX: Unspecified hemorrhoids: K64.9

## 2014-03-20 HISTORY — DX: Gastro-esophageal reflux disease without esophagitis: K21.9

## 2014-03-20 HISTORY — DX: Unspecified asthma, uncomplicated: J45.909

## 2014-03-20 HISTORY — DX: Epistaxis: R04.0

## 2014-03-20 HISTORY — DX: Unspecified osteoarthritis, unspecified site: M19.90

## 2014-03-20 HISTORY — DX: Rosacea, unspecified: L71.9

## 2014-03-20 HISTORY — DX: Other allergy status, other than to drugs and biological substances: Z91.09

## 2014-03-20 HISTORY — DX: Pure hypercholesterolemia, unspecified: E78.00

## 2014-03-20 HISTORY — DX: Shortness of breath: R06.02

## 2014-03-20 HISTORY — DX: Personal history of other infectious and parasitic diseases: Z86.19

## 2014-03-20 HISTORY — DX: Unspecified hearing loss, bilateral: H91.93

## 2014-03-20 HISTORY — DX: Headache: R51

## 2014-03-20 HISTORY — DX: Personal history of urinary calculi: Z87.442

## 2014-03-20 LAB — BASIC METABOLIC PANEL
Anion gap: 13 (ref 5–15)
BUN: 25 mg/dL — AB (ref 6–23)
CHLORIDE: 100 meq/L (ref 96–112)
CO2: 25 meq/L (ref 19–32)
Calcium: 10.2 mg/dL (ref 8.4–10.5)
Creatinine, Ser: 1.28 mg/dL (ref 0.50–1.35)
GFR calc Af Amer: 63 mL/min — ABNORMAL LOW (ref >90)
GFR calc non Af Amer: 54 mL/min — ABNORMAL LOW (ref >90)
Glucose, Bld: 119 mg/dL — ABNORMAL HIGH (ref 70–99)
Potassium: 4.5 mEq/L (ref 3.7–5.3)
Sodium: 138 mEq/L (ref 137–147)

## 2014-03-20 LAB — CBC
HEMATOCRIT: 35.3 % — AB (ref 39.0–52.0)
HEMOGLOBIN: 11.1 g/dL — AB (ref 13.0–17.0)
MCH: 28.3 pg (ref 26.0–34.0)
MCHC: 31.4 g/dL (ref 30.0–36.0)
MCV: 90.1 fL (ref 78.0–100.0)
Platelets: 182 10*3/uL (ref 150–400)
RBC: 3.92 MIL/uL — ABNORMAL LOW (ref 4.22–5.81)
RDW: 17.6 % — ABNORMAL HIGH (ref 11.5–15.5)
WBC: 6.3 10*3/uL (ref 4.0–10.5)

## 2014-03-20 NOTE — Patient Instructions (Addendum)
20 Marc Gutierrez  03/20/2014   Your procedure is scheduled on: Tuesday 03/28/14  Report to [facility] at 07:30 AM.  Call this number if you have problems the morning of surgery [phone]: [phone]   Remember: please bring inhaler and CPAP mask and tubing on day of surgery    Do not eat food or drink liquids After Midnight.     Take these medicines the morning of surgery with A SIP OF WATER: inhalers if needed, omeprazole, montelukast   Do not wear jewelry, make-up or nail polish.  Do not wear lotions, powders, or perfumes. You may wear deodorant.  Do not shave 48 hours prior to surgery. Men may shave face and neck.  Do not bring valuables to the hospital.  Contacts, dentures or bridgework may not be worn into surgery.   Patients discharged the day of surgery will not be allowed to drive home.  Name and phone number of your driver: [name] [phone]   [name], RN  pre op nurse call if needed [phone]    [facility] - Preparing for Surgery Before surgery, you can play an important role.  Because skin is not sterile, your skin needs to be as free of germs as possible.  You can reduce the number of germs on your skin by washing with CHG (chlorahexidine gluconate) soap before surgery.  CHG is an antiseptic cleaner which kills germs and bonds with the skin to continue killing germs even after washing. Please DO NOT use if you have an allergy to CHG or antibacterial soaps.  If your skin becomes reddened/irritated stop using the CHG and inform your nurse when you arrive at Short Stay. Do not shave (including legs and underarms) for at least 48 hours prior to the first CHG shower.  You may shave your face/neck. Please follow these instructions carefully:  1.  Shower with CHG Soap the night before surgery and the  morning of Surgery.  2.  If you choose to wash your hair, wash your hair first as usual with your  normal  shampoo.  3.  After you shampoo, rinse your hair and  body thoroughly to remove the  shampoo.                            4.  Use CHG as you would any other liquid soap.  You can apply chg directly  to the skin and wash                       Gently with a scrungie or clean washcloth.  5.  Apply the CHG Soap to your body ONLY FROM THE NECK DOWN.   Do not use on face/ open                           Wound or open sores. Avoid contact with eyes, ears mouth and genitals (private parts).                       Wash face,  Genitals (private parts) with your normal soap.             6.  Wash thoroughly, paying special attention to the area where your surgery  will be performed.  7.  Thoroughly rinse your body with warm water from the neck down.  8.  DO NOT shower/wash  with your normal soap after using and rinsing off  the CHG Soap.                9.  Pat yourself dry with a clean towel.            10.  Wear clean pajamas.            11.  Place clean sheets on your bed the night of your first shower and do not  sleep with pets. Day of Surgery : Do not apply any lotions/deodorants the morning of surgery.  Please wear clean clothes to the hospital/surgery center.  FAILURE TO FOLLOW THESE INSTRUCTIONS MAY RESULT IN THE CANCELLATION OF YOUR SURGERY PATIENT SIGNATURE_________________________________  NURSE SIGNATURE__________________________________  ________________________________________________________________________

## 2014-03-28 ENCOUNTER — Ambulatory Visit (HOSPITAL_COMMUNITY)
Admission: RE | Admit: 2014-03-28 | Discharge: 2014-03-28 | Disposition: A | Discharge status: Home / Self Care | Payer: Medicare Other | Source: Ambulatory Visit | Attending: Urology | Admitting: Urology

## 2014-03-28 ENCOUNTER — Encounter (HOSPITAL_COMMUNITY)
Admission: RE | Disposition: A | Discharge status: Home / Self Care | Payer: Self-pay | Source: Ambulatory Visit | Attending: Urology

## 2014-03-28 ENCOUNTER — Ambulatory Visit (HOSPITAL_COMMUNITY): Payer: Medicare Other | Admitting: Certified Registered Nurse Anesthetist

## 2014-03-28 ENCOUNTER — Encounter (HOSPITAL_COMMUNITY): Payer: Medicare Other | Admitting: Certified Registered Nurse Anesthetist

## 2014-03-28 ENCOUNTER — Encounter (HOSPITAL_COMMUNITY): Payer: Self-pay | Admitting: *Deleted

## 2014-03-28 DIAGNOSIS — J449 Chronic obstructive pulmonary disease, unspecified: Secondary | ICD-10-CM | POA: Diagnosis not present

## 2014-03-28 DIAGNOSIS — R059 Cough, unspecified: Secondary | ICD-10-CM | POA: Insufficient documentation

## 2014-03-28 DIAGNOSIS — H409 Unspecified glaucoma: Secondary | ICD-10-CM | POA: Insufficient documentation

## 2014-03-28 DIAGNOSIS — R05 Cough: Secondary | ICD-10-CM | POA: Insufficient documentation

## 2014-03-28 DIAGNOSIS — I10 Essential (primary) hypertension: Secondary | ICD-10-CM | POA: Insufficient documentation

## 2014-03-28 DIAGNOSIS — E7800 Pure hypercholesterolemia, unspecified: Secondary | ICD-10-CM | POA: Diagnosis not present

## 2014-03-28 DIAGNOSIS — G4730 Sleep apnea, unspecified: Secondary | ICD-10-CM | POA: Diagnosis not present

## 2014-03-28 DIAGNOSIS — R0602 Shortness of breath: Secondary | ICD-10-CM | POA: Diagnosis not present

## 2014-03-28 DIAGNOSIS — R39198 Other difficulties with micturition: Secondary | ICD-10-CM | POA: Diagnosis present

## 2014-03-28 DIAGNOSIS — J4489 Other specified chronic obstructive pulmonary disease: Secondary | ICD-10-CM | POA: Insufficient documentation

## 2014-03-28 DIAGNOSIS — R3911 Hesitancy of micturition: Secondary | ICD-10-CM | POA: Insufficient documentation

## 2014-03-28 DIAGNOSIS — Z7982 Long term (current) use of aspirin: Secondary | ICD-10-CM | POA: Diagnosis not present

## 2014-03-28 DIAGNOSIS — K219 Gastro-esophageal reflux disease without esophagitis: Secondary | ICD-10-CM | POA: Insufficient documentation

## 2014-03-28 DIAGNOSIS — Z5309 Procedure and treatment not carried out because of other contraindication: Secondary | ICD-10-CM | POA: Diagnosis not present

## 2014-03-28 DIAGNOSIS — Z85828 Personal history of other malignant neoplasm of skin: Secondary | ICD-10-CM | POA: Diagnosis not present

## 2014-03-28 DIAGNOSIS — Z79899 Other long term (current) drug therapy: Secondary | ICD-10-CM | POA: Diagnosis not present

## 2014-03-28 DIAGNOSIS — Z87891 Personal history of nicotine dependence: Secondary | ICD-10-CM | POA: Insufficient documentation

## 2014-03-28 DIAGNOSIS — R609 Edema, unspecified: Secondary | ICD-10-CM | POA: Diagnosis not present

## 2014-03-28 LAB — GLUCOSE, CAPILLARY: Glucose-Capillary: 94 mg/dL (ref 70–99)

## 2014-03-28 SURGERY — CYSTOSCOPY
Anesthesia: General

## 2014-03-28 MED ORDER — CEFAZOLIN SODIUM-DEXTROSE 2-3 GM-% IV SOLR: 2.0000 g | INTRAVENOUS | Status: DC

## 2014-03-28 MED ORDER — CEFAZOLIN SODIUM-DEXTROSE 2-3 GM-% IV SOLR
INTRAVENOUS | Status: AC
  Filled 2014-03-28: qty 50

## 2014-03-28 MED ORDER — PROPOFOL 10 MG/ML IV BOLUS
INTRAVENOUS | Status: AC
  Filled 2014-03-28: qty 20

## 2014-03-28 MED ORDER — MIDAZOLAM HCL 2 MG/2ML IJ SOLN
INTRAMUSCULAR | Status: AC
  Filled 2014-03-28: qty 2

## 2014-03-28 MED ORDER — ONDANSETRON HCL 4 MG/2ML IJ SOLN
INTRAMUSCULAR | Status: AC
  Filled 2014-03-28: qty 2

## 2014-03-28 MED ORDER — FENTANYL CITRATE 0.05 MG/ML IJ SOLN
INTRAMUSCULAR | Status: AC
  Filled 2014-03-28: qty 5

## 2014-03-28 MED ORDER — LIDOCAINE HCL (CARDIAC) 20 MG/ML IV SOLN
INTRAVENOUS | Status: AC
  Filled 2014-03-28: qty 5

## 2014-03-28 MED ORDER — SODIUM CHLORIDE 0.9 % IR SOLN
Status: DC | PRN
Start: 2014-03-28 — End: 2014-03-30

## 2014-03-28 MED ORDER — LACTATED RINGERS IV SOLN
INTRAVENOUS | Status: DC
  Administered 2014-03-28: 1000 mL via INTRAVENOUS

## 2014-03-28 NOTE — Progress Notes (Signed)
Surgery Canceled due to worsening Pedal edema and impending cardiac evaluation.

## 2014-03-28 NOTE — H&P (Signed)
H&P   History of Present Illness: Patient presents for cystoscopy and transurethral incision of the bladder neck. He continues to complain of weak stream and hesitancy. He said in addition area fever gross hematuria. He was seen by cardiology and considered to be low risk. He did have 1+ bilateral lower extremity edema when seen by cardiology, but pt c/o of increased edema in bilateral LE. Patient has had no chest pain, but more shortness of breath. He also has some cough and subjective night sweats.    Past Medical History  Diagnosis Date  . Diabetes mellitus 05-17-12    "Borderrline"-watches, no meds  . Hypertension   . Sleep apnea 05-17-12    Sleep Apnea-uses cpap  . Glaucoma 05-17-12    tx. surgery and eye drops  . COPD (chronic obstructive pulmonary disease)   . Cancer 05-17-12    skin cancer face in past, hx. Rosacea(face,chest.  . Hypercholesteremia   . Dysrhythmia     "small leakage in valve-being monitored"  . Shortness of breath   . History of kidney stones 1970's    x1  . GERD (gastroesophageal reflux disease)   . Arthritis   . Headache(784.0)     sinus headaches  . Hemorrhoids   . Rosacea   . History of shingles     in ears  . 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 Bleeding nose     hx of recurrent nose bleeds  . Multiple environmental allergies   . Asthma    Past Surgical History  Procedure Laterality Date  . Cataract extraction, bilateral  2000    bilateral  . Orchiectomy  early 70's    right testicle removed(deteriation)  . Vasectomy  1980's  . Nasal cautery  2013    due to nose bleed  . Hernia repair      x3  . Knee arthroscopy  05/19/2012    Procedure: ARTHROSCOPY KNEE;  Surgeon: Gearlean Alf, MD;  Location: [facility];  Service: Orthopedics;  Laterality: Left;  with Synovectomy  . Hemorrhoid surgery  1980s  . Hemorrhoid banding  01/03/2013    x2 piles  . Skin cancer destruction    . Mouth surgery  1970's    "remove bone from mouth"  . Prostate  surgery  1970's    "gland surgery"  . Eye surgery  2000    Glaucoma surgery  . Joint replacement Bilateral 2013, 2014  . Carpal tunnel release Bilateral 2000  . Tympanostomy tube placement Right 2010, 2015    x2    Home Medications:  Prescriptions prior to admission  Medication Sig Dispense Refill  . aspirin 81 MG chewable tablet Chew 81 mg by mouth every other day.      . benzonatate (TESSALON) 200 MG capsule Take 200 mg by mouth 3 (three) times daily as needed for cough.      . betamethasone valerate lotion (VALISONE) 0.1 % Apply 1 application topically 2 (two) times daily.      . Biotin 5000 MCG TABS Take 1 tablet by mouth daily.      . budesonide-formoterol (SYMBICORT) 160-4.5 MCG/ACT inhaler Inhale 2 puffs into the lungs 2 (two) times daily.       . Calcium Carb-Cholecalciferol (CALCIUM 600 + D PO) Take 1 tablet by mouth 2 (two) times daily.      . furosemide (LASIX) 20 MG tablet Take 20 mg by mouth 2 (two) times daily.      Marland Kitchen  ipratropium-albuterol (DUONEB) 0.5-2.5 (3) MG/3ML SOLN Take 3 mLs by nebulization every 4 (four) hours as needed.      . latanoprost (XALATAN) 0.005 % ophthalmic solution Place 1 drop into both eyes at bedtime.      Marland Kitchen lisinopril (PRINIVIL,ZESTRIL) 20 MG tablet Take 20 mg by mouth every morning.       . montelukast (SINGULAIR) 10 MG tablet Take 10 mg by mouth daily.      . Multiple Vitamin (MULTIVITAMIN WITH MINERALS) TABS tablet Take 1 tablet by mouth daily.      Marland Kitchen omeprazole (PRILOSEC) 40 MG capsule Take 40 mg by mouth 2 (two) times daily.      . polyethylene glycol (MIRALAX / GLYCOLAX) packet Take 17 g by mouth daily.      . simvastatin (ZOCOR) 40 MG tablet Take 40 mg by mouth every evening.      Marland Kitchen ammonium lactate (LAC-HYDRIN) 12 % lotion Apply 1 application topically daily.       Allergies: No Known Allergies  Family History  Problem Relation Age of Onset  . Heart attack Father   . Stroke Mother   . Diabetes Brother    Social History:  reports  that he quit smoking about 44 years ago. His smoking use included Cigarettes. He has a 30 pack-year smoking history. He has quit using smokeless tobacco. His smokeless tobacco use included Chew and Snuff. He reports that he does not drink alcohol or use illicit drugs.  ROS: A complete review of systems was performed.  All systems are negative except for pertinent findings as noted. ROS   Physical Exam:  Vital signs in last 24 hours: Temp:  [98.2 F (36.8 C)] 98.2 F (36.8 C) (07/28 0625) Pulse Rate:  [81] 81 (07/28 0625) Resp:  [16] 16 (07/28 0625) BP: (132)/(64) 132/64 mmHg (07/28 0625) SpO2:  [98 %] 98 % (07/28 0625) General:  Alert and oriented, No acute distress HEENT: Normocephalic, atraumatic Neck: No JVD or lymphadenopathy Cardiovascular: Regular rate and rhythm Lungs: Regular rate and effort Abdomen: Soft, nontender, nondistended, no abdominal masses Back: No CVA tenderness Extremities: 2+ edema left, 3+ right Neurologic: Grossly intact  Laboratory Data:  Results for orders placed during the hospital encounter of 03/28/14 (from the past 24 hour(s))  GLUCOSE, CAPILLARY     Status: None   Collection Time    03/28/14  6:32 AM      Result Value Ref Range   Glucose-Capillary 94  70 - 99 mg/dL   Comment 1 Notify RN     No results found for this or any previous visit (from the past 240 hour(s)). Creatinine: No results found for this basename: CREATININE,  in the last 168 hours  Impression/Assessment/plan:  -Discussed with Dr. Delma Post and given patient's  Increased LE edema, possible SOB, refusal of spinal we will cancel case and try to medically optimize for future. Case is considered elective.     Festus Aloe 03/28/2014, 9:36 AM

## 2014-03-28 NOTE — Interval H&P Note (Signed)
History and Physical Interval Note:  03/28/2014 9:55 AM  I discussed with patient, wife and anesthesia again. We would not be comfortable proceeding even with a spinal.    Festus Aloe

## 2014-03-28 NOTE — Anesthesia Preprocedure Evaluation (Addendum)
Anesthesia Evaluation  Patient identified by MRN, date of birth, ID band Patient awake    Reviewed: Allergy & Precautions, H&P , NPO status , Patient's Chart, lab work & pertinent test results  Airway Mallampati: II TM Distance: >3 FB Neck ROM: Full    Dental no notable dental hx.    Pulmonary shortness of breath, asthma , sleep apnea and Continuous Positive Airway Pressure Ventilation , COPD COPD inhaler, former smoker,  breath sounds clear to auscultation  Pulmonary exam normal       Cardiovascular hypertension, Pt. on medications + dysrhythmias Rhythm:Regular Rate:Normal     Neuro/Psych  Headaches, negative psych ROS   GI/Hepatic Neg liver ROS, GERD-  Medicated and Controlled,  Endo/Other  negative endocrine ROSdiabetes  Renal/GU negative Renal ROS  negative genitourinary   Musculoskeletal negative musculoskeletal ROS (+)   Abdominal   Peds negative pediatric ROS (+)  Hematology negative hematology ROS (+)   Anesthesia Other Findings   Reproductive/Obstetrics negative OB ROS                        Anesthesia Physical Anesthesia Plan  ASA: III  Anesthesia Plan: General   Post-op Pain Management:    Induction: Intravenous  Airway Management Planned: LMA  Additional Equipment:   Intra-op Plan:   Post-operative Plan: Extubation in OR  Informed Consent: I have reviewed the patients History and Physical, chart, labs and discussed the procedure including the risks, benefits and alternatives for the proposed anesthesia with the patient or authorized representative who has indicated his/her understanding and acceptance.   Dental advisory given  Plan Discussed with: CRNA  Anesthesia Plan Comments: (Discussed general and spinal. Patient strongly prefers general.  [id number]: After discussion with Dr. Junious Silk, patient will be cancelled today pending workup into worsening pedal edema,  worsening cough.)      Anesthesia Quick Evaluation

## 2014-03-28 NOTE — Progress Notes (Deleted)
Gauze applied over Right lower leg anterior.  Weeping noted, guaze and kerlex applied as Dr. Alvan Dame requested.

## 2014-03-28 NOTE — OR Nursing (Signed)
Case cancelled per Dr. Junious Silk in [place] before going back to OR room.

## 2014-04-13 ENCOUNTER — Other Ambulatory Visit: Payer: Self-pay | Admitting: Urology

## 2014-04-27 ENCOUNTER — Encounter (HOSPITAL_COMMUNITY): Payer: Self-pay | Admitting: Pharmacy Technician

## 2014-04-27 NOTE — Patient Instructions (Signed)
Marc Gutierrez  04/27/2014   Your procedure is scheduled on:  05/09/2014    Report to [facility].  Follow the Signs to [place] at  0855      am  Call this number if you have problems the morning of surgery: [phone]   Remember:   Do not eat food or drink liquids after midnight.   Take these medicines the morning of surgery with A SIP OF WATER:    Do not wear jewelry,   Do not wear lotions, powders, or perfumes. , deodorant.    . Men may shave face and neck.  Do not bring valuables to the hospital.  Contacts, dentures or bridgework may not be worn into surgery.     Patients discharged the day of surgery will not be allowed to drive  home.  Name and phone number of your driver:      Please read over the following fact sheets that you were given: [facility] - Preparing for Surgery Before surgery, you can play an important role.  Because skin is not sterile, your skin needs to be as free of germs as possible.  You can reduce the number of germs on your skin by washing with CHG (chlorahexidine gluconate) soap before surgery.  CHG is an antiseptic cleaner which kills germs and bonds with the skin to continue killing germs even after washing. Please DO NOT use if you have an allergy to CHG or antibacterial soaps.  If your skin becomes reddened/irritated stop using the CHG and inform your nurse when you arrive at Short Stay. Do not shave (including legs and underarms) for at least 48 hours prior to the first CHG shower.  You may shave your face/neck. Please follow these instructions carefully:  1.  Shower with CHG Soap the night before surgery and the  morning of Surgery.  2.  If you choose to wash your hair, wash your hair first as usual with your  normal  shampoo.  3.  After you shampoo, rinse your hair and body thoroughly to remove the  shampoo.                           4.  Use CHG as you would any other liquid soap.  You can apply chg directly  to the skin and wash                        Gently with a scrungie or clean washcloth.  5.  Apply the CHG Soap to your body ONLY FROM THE NECK DOWN.   Do not use on face/ open                           Wound or open sores. Avoid contact with eyes, ears mouth and genitals (private parts).                       Wash face,  Genitals (private parts) with your normal soap.             6.  Wash thoroughly, paying special attention to the area where your surgery  will be performed.  7.  Thoroughly rinse your body with warm water from the neck down.  8.  DO NOT shower/wash with your normal soap after using and rinsing off  the CHG Soap.  9.  Pat yourself dry with a clean towel.            10.  Wear clean pajamas.            11.  Place clean sheets on your bed the night of your first shower and do not  sleep with pets. Day of Surgery : Do not apply any lotions/deodorants the morning of surgery.  Please wear clean clothes to the hospital/surgery center.  FAILURE TO FOLLOW THESE INSTRUCTIONS MAY RESULT IN THE CANCELLATION OF YOUR SURGERY PATIENT SIGNATURE_________________________________  NURSE SIGNATURE__________________________________  ________________________________________________________________________ , coughing and deep breathing exercises, leg exercises

## 2014-05-01 ENCOUNTER — Encounter (INDEPENDENT_AMBULATORY_CARE_PROVIDER_SITE_OTHER): Payer: Self-pay

## 2014-05-01 ENCOUNTER — Encounter (HOSPITAL_COMMUNITY): Payer: Self-pay

## 2014-05-01 ENCOUNTER — Encounter (HOSPITAL_COMMUNITY)
Admission: RE | Admit: 2014-05-01 | Discharge: 2014-05-01 | Disposition: A | Discharge status: Home / Self Care | Payer: Medicare Other | Source: Ambulatory Visit | Attending: Urology | Admitting: Urology

## 2014-05-01 DIAGNOSIS — Z01818 Encounter for other preprocedural examination: Secondary | ICD-10-CM | POA: Diagnosis not present

## 2014-05-01 DIAGNOSIS — N320 Bladder-neck obstruction: Secondary | ICD-10-CM | POA: Diagnosis present

## 2014-05-01 LAB — CBC
HCT: 37 % — ABNORMAL LOW (ref 39.0–52.0)
Hemoglobin: 11.1 g/dL — ABNORMAL LOW (ref 13.0–17.0)
MCH: 28.6 pg (ref 26.0–34.0)
MCHC: 30 g/dL (ref 30.0–36.0)
MCV: 95.4 fL (ref 78.0–100.0)
PLATELETS: 216 10*3/uL (ref 150–400)
RBC: 3.88 MIL/uL — AB (ref 4.22–5.81)
RDW: 17.8 % — ABNORMAL HIGH (ref 11.5–15.5)
WBC: 5.9 10*3/uL (ref 4.0–10.5)

## 2014-05-01 LAB — BASIC METABOLIC PANEL
ANION GAP: 12 (ref 5–15)
BUN: 21 mg/dL (ref 6–23)
CHLORIDE: 103 meq/L (ref 96–112)
CO2: 27 meq/L (ref 19–32)
CREATININE: 1.4 mg/dL — AB (ref 0.50–1.35)
Calcium: 10.9 mg/dL — ABNORMAL HIGH (ref 8.4–10.5)
GFR calc Af Amer: 56 mL/min — ABNORMAL LOW (ref >90)
GFR calc non Af Amer: 49 mL/min — ABNORMAL LOW (ref >90)
Glucose, Bld: 132 mg/dL — ABNORMAL HIGH (ref 70–99)
POTASSIUM: 4.8 meq/L (ref 3.7–5.3)
Sodium: 142 mEq/L (ref 137–147)

## 2014-05-01 NOTE — Progress Notes (Signed)
LOV 04/07/2014  - Dr Jimmie Molly on chart

## 2014-05-09 ENCOUNTER — Encounter (HOSPITAL_COMMUNITY)
Admission: RE | Disposition: A | Discharge status: Home / Self Care | Payer: Self-pay | Source: Ambulatory Visit | Attending: Urology

## 2014-05-09 ENCOUNTER — Ambulatory Visit (HOSPITAL_COMMUNITY)
Admission: RE | Admit: 2014-05-09 | Discharge: 2014-05-09 | Disposition: A | Discharge status: Home / Self Care | Payer: Medicare Other | Source: Ambulatory Visit | Attending: Urology | Admitting: Urology

## 2014-05-09 ENCOUNTER — Ambulatory Visit (HOSPITAL_COMMUNITY): Payer: Medicare Other | Admitting: *Deleted

## 2014-05-09 ENCOUNTER — Encounter (HOSPITAL_COMMUNITY): Payer: Self-pay | Admitting: *Deleted

## 2014-05-09 ENCOUNTER — Encounter (HOSPITAL_COMMUNITY): Payer: Medicare Other | Admitting: *Deleted

## 2014-05-09 DIAGNOSIS — Z87891 Personal history of nicotine dependence: Secondary | ICD-10-CM | POA: Insufficient documentation

## 2014-05-09 DIAGNOSIS — K219 Gastro-esophageal reflux disease without esophagitis: Secondary | ICD-10-CM | POA: Insufficient documentation

## 2014-05-09 DIAGNOSIS — G4730 Sleep apnea, unspecified: Secondary | ICD-10-CM | POA: Insufficient documentation

## 2014-05-09 DIAGNOSIS — I10 Essential (primary) hypertension: Secondary | ICD-10-CM | POA: Diagnosis not present

## 2014-05-09 DIAGNOSIS — E7800 Pure hypercholesterolemia, unspecified: Secondary | ICD-10-CM | POA: Insufficient documentation

## 2014-05-09 DIAGNOSIS — M129 Arthropathy, unspecified: Secondary | ICD-10-CM | POA: Diagnosis not present

## 2014-05-09 DIAGNOSIS — E119 Type 2 diabetes mellitus without complications: Secondary | ICD-10-CM | POA: Diagnosis not present

## 2014-05-09 DIAGNOSIS — J449 Chronic obstructive pulmonary disease, unspecified: Secondary | ICD-10-CM | POA: Diagnosis not present

## 2014-05-09 DIAGNOSIS — R51 Headache: Secondary | ICD-10-CM | POA: Diagnosis not present

## 2014-05-09 DIAGNOSIS — Z85828 Personal history of other malignant neoplasm of skin: Secondary | ICD-10-CM | POA: Insufficient documentation

## 2014-05-09 DIAGNOSIS — Z79899 Other long term (current) drug therapy: Secondary | ICD-10-CM | POA: Insufficient documentation

## 2014-05-09 DIAGNOSIS — J4489 Other specified chronic obstructive pulmonary disease: Secondary | ICD-10-CM | POA: Insufficient documentation

## 2014-05-09 DIAGNOSIS — R39198 Other difficulties with micturition: Secondary | ICD-10-CM | POA: Insufficient documentation

## 2014-05-09 HISTORY — PX: TRANSURETHRAL INCISION OF BLADDER NECK: SHX6152

## 2014-05-09 LAB — GLUCOSE, CAPILLARY: Glucose-Capillary: 105 mg/dL — ABNORMAL HIGH (ref 70–99)

## 2014-05-09 SURGERY — INCISION, BLADDER NECK, TRANSURETHRAL
Anesthesia: General | Site: Bladder

## 2014-05-09 MED ORDER — BELLADONNA ALKALOIDS-OPIUM 16.2-60 MG RE SUPP
RECTAL | Status: AC
  Filled 2014-05-09: qty 1

## 2014-05-09 MED ORDER — FENTANYL CITRATE 0.05 MG/ML IJ SOLN
25.0000 ug | INTRAMUSCULAR | Status: DC | PRN
Start: 2014-05-09 — End: 2014-05-09

## 2014-05-09 MED ORDER — PROPOFOL 10 MG/ML IV BOLUS
INTRAVENOUS | Status: DC | PRN
  Administered 2014-05-09: 170 mg via INTRAVENOUS

## 2014-05-09 MED ORDER — LIDOCAINE HCL 1 % IJ SOLN
INTRAMUSCULAR | Status: DC | PRN
  Administered 2014-05-09: 50 mg via INTRADERMAL

## 2014-05-09 MED ORDER — SODIUM CHLORIDE 0.9 % IR SOLN
Status: DC | PRN
  Administered 2014-05-09: 6000 mL

## 2014-05-09 MED ORDER — PROMETHAZINE HCL 25 MG/ML IJ SOLN: 6.2500 mg | INTRAMUSCULAR | Status: DC | PRN

## 2014-05-09 MED ORDER — ONDANSETRON HCL 4 MG/2ML IJ SOLN
INTRAMUSCULAR | Status: AC
  Filled 2014-05-09: qty 2

## 2014-05-09 MED ORDER — LIDOCAINE HCL (CARDIAC) 20 MG/ML IV SOLN
INTRAVENOUS | Status: AC
  Filled 2014-05-09: qty 5

## 2014-05-09 MED ORDER — FENTANYL CITRATE 0.05 MG/ML IJ SOLN
INTRAMUSCULAR | Status: DC | PRN
  Administered 2014-05-09: 50 ug via INTRAVENOUS

## 2014-05-09 MED ORDER — LIDOCAINE HCL 2 % EX GEL
CUTANEOUS | Status: AC
  Filled 2014-05-09: qty 10

## 2014-05-09 MED ORDER — LACTATED RINGERS IV SOLN
INTRAVENOUS | Status: DC
  Administered 2014-05-09: 1000 mL via INTRAVENOUS

## 2014-05-09 MED ORDER — MIDAZOLAM HCL 2 MG/2ML IJ SOLN
INTRAMUSCULAR | Status: AC
Start: 2014-05-09 — End: 2014-05-09
  Filled 2014-05-09: qty 2

## 2014-05-09 MED ORDER — CEFAZOLIN SODIUM-DEXTROSE 2-3 GM-% IV SOLR
INTRAVENOUS | Status: AC
  Filled 2014-05-09: qty 50

## 2014-05-09 MED ORDER — 0.9 % SODIUM CHLORIDE (POUR BTL) OPTIME
TOPICAL | Status: DC | PRN
  Administered 2014-05-09: 1000 mL

## 2014-05-09 MED ORDER — PHENYLEPHRINE 40 MCG/ML (10ML) SYRINGE FOR IV PUSH (FOR BLOOD PRESSURE SUPPORT)
PREFILLED_SYRINGE | INTRAVENOUS | Status: AC
  Filled 2014-05-09: qty 10

## 2014-05-09 MED ORDER — FENTANYL CITRATE 0.05 MG/ML IJ SOLN
INTRAMUSCULAR | Status: AC
  Filled 2014-05-09: qty 5

## 2014-05-09 MED ORDER — NEOSTIGMINE METHYLSULFATE 10 MG/10ML IV SOLN
INTRAVENOUS | Status: AC
  Filled 2014-05-09: qty 1

## 2014-05-09 MED ORDER — PROPOFOL 10 MG/ML IV BOLUS
INTRAVENOUS | Status: AC
  Filled 2014-05-09: qty 20

## 2014-05-09 MED ORDER — GLYCOPYRROLATE 0.2 MG/ML IJ SOLN
INTRAMUSCULAR | Status: AC
  Filled 2014-05-09: qty 4

## 2014-05-09 MED ORDER — CEFAZOLIN SODIUM-DEXTROSE 2-3 GM-% IV SOLR
2.0000 g | INTRAVENOUS | Status: AC
  Administered 2014-05-09: 2 g via INTRAVENOUS

## 2014-05-09 MED ORDER — PHENYLEPHRINE HCL 10 MG/ML IJ SOLN
INTRAMUSCULAR | Status: DC | PRN
  Administered 2014-05-09: 60 ug via INTRAVENOUS

## 2014-05-09 SURGICAL SUPPLY — 22 items
BAG URINE DRAINAGE (UROLOGICAL SUPPLIES) ×1 IMPLANT
BAG URO CATCHER STRL LF (DRAPE) ×3 IMPLANT
BLADE SURG 15 STRL LF DISP TIS (BLADE) IMPLANT
BLADE SURG 15 STRL SS (BLADE)
CATH FOLEY 3WAY 30CC 22FR (CATHETERS) ×1 IMPLANT
DRAPE CAMERA CLOSED 9X96 (DRAPES) ×3 IMPLANT
ELECT BUTTON HF 24-28F 2 30DE (ELECTRODE) ×1 IMPLANT
ELECT HF RESECT BIPO 24F 45 ND (CUTTING LOOP) ×1 IMPLANT
ELECT LOOP MED HF 24F 12D (CUTTING LOOP) ×1 IMPLANT
EVACUATOR MICROVAS BLADDER (UROLOGICAL SUPPLIES) ×1 IMPLANT
GLOVE BIOGEL M STRL SZ7.5 (GLOVE) ×3 IMPLANT
GOWN STRL REUS W/TWL LRG LVL3 (GOWN DISPOSABLE) ×3 IMPLANT
GOWN STRL REUS W/TWL XL LVL3 (GOWN DISPOSABLE) ×3 IMPLANT
HOLDER FOLEY CATH W/STRAP (MISCELLANEOUS) IMPLANT
IV NS IRRIG 3000ML ARTHROMATIC (IV SOLUTION) ×4 IMPLANT
KIT ASPIRATION TUBING (SET/KITS/TRAYS/PACK) ×1 IMPLANT
MANIFOLD NEPTUNE II (INSTRUMENTS) ×3 IMPLANT
PACK CYSTO (CUSTOM PROCEDURE TRAY) ×3 IMPLANT
SUT ETHILON 3 0 PS 1 (SUTURE) IMPLANT
SYR 30ML LL (SYRINGE) IMPLANT
TUBING CONNECTING 10 (TUBING) ×2 IMPLANT
TUBING CONNECTING 10' (TUBING) ×1

## 2014-05-09 NOTE — Anesthesia Postprocedure Evaluation (Signed)
  Anesthesia Post-op Note  Patient: Marc Gutierrez  Procedure(s) Performed: Procedure(s) (LRB): cystoscopy and exam under anesthesia (N/A)  Patient Location: PACU  Anesthesia Type: General  Level of Consciousness: awake and alert   Airway and Oxygen Therapy: Patient Spontanous Breathing  Post-op Pain: mild  Post-op Assessment: Post-op Vital signs reviewed, Patient's Cardiovascular Status Stable, Respiratory Function Stable, Patent Airway and No signs of Nausea or vomiting  Last Vitals:  Filed Vitals:   05/09/14 1345  BP: 132/75  Pulse: 75  Temp: 36.4 C  Resp: 14    Post-op Vital Signs: stable   Complications: No apparent anesthesia complications

## 2014-05-09 NOTE — Op Note (Signed)
Preoperative diagnosis: Weak stream Postoperative diagnosis: Weak stream  Procedure: Exam under anesthesia Cystoscopy  Surgeon: Junious Silk  Anesthesia: [place]  Type anesthesia: Gen.  Findings: On exam under anesthesia the penis was normal without mass or lesion. He had one testicle which was palpably normal. On digital rectal exam the prostate was small and benign. There were no hard areas or nodules. The lateral sulci were preserved.  On cystoscopy the urethra appeared normal. The prostate was adequately resected, the bladder neck appeared patent. It was calibrated to at least 98 [place] with the resectoscope which passed easily. In my opinion the risks of transurethral incision of the bladder neck would outweigh any benefit. The trigone and ureteral orifices were in their normal orthotopic position. The bladder mucosa appeared normal although somewhat hyperemic. There were no mucosal lesions. There were no stones or foreign bodies in the bladder.  Description of procedure: After consent was obtained patient brought to the operating room. After adequate anesthesia he was placed in lithotomy position and prepped and draped in the usual sterile fashion. A timeout was performed to confirm the patient and procedure. An exam under anesthesia was performed. Cystoscope was passed per urethra. A 22 French rigid cystoscope passed easily. The bladder was carefully inspected. The 26 French resectoscope sheath was passed with the visual obturator and this also passed easily into the bladder. The bladder was drained and the scope removed. The patient was awakened taken to recovery room in stable condition.  Complications: None Blood loss: Minimal Specimens: None Drains: None  Disposition: Patient stable to PACU

## 2014-05-09 NOTE — Transfer of Care (Signed)
Immediate Anesthesia Transfer of Care Note  Patient: Marc Gutierrez  Procedure(s) Performed: Procedure(s): cystoscopy and exam under anesthesia (N/A)  Patient Location: PACU  Anesthesia Type:General  Level of Consciousness: sedated and patient cooperative  Airway & Oxygen Therapy: Patient Spontanous Breathing and Patient connected to face mask oxygen  Post-op Assessment: Report given to PACU RN, Post -op Vital signs reviewed and stable and Patient moving all extremities  Post vital signs: Reviewed and stable  Complications: No apparent anesthesia complications

## 2014-05-09 NOTE — H&P (Signed)
H&P  History of Present Illness: Pt with weak stream. Bladder neck in office was quite tight. He presents for endoscopic exam and possible TUIBN. He is well. No CP, SOB or cough. His LE edema is significantly improved and pretty much resolved.   Past Medical History  Diagnosis Date  . Diabetes mellitus 05-17-12    "Borderrline"-watches, no meds  . Hypertension   . Sleep apnea 05-17-12    Sleep Apnea-uses cpap  . Glaucoma 05-17-12    tx. surgery and eye drops  . COPD (chronic obstructive pulmonary disease)   . Hypercholesteremia   . Dysrhythmia     "small leakage in valve-being monitored"  . Shortness of breath   . History of kidney stones 1970's    x1  . GERD (gastroesophageal reflux disease)   . Arthritis   . Headache(784.0)     sinus headaches  . Hemorrhoids   . Rosacea   . History of shingles     in ears  . 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 Bleeding nose     hx of recurrent nose bleeds  . Multiple environmental allergies   . Asthma   . Cancer 05-17-12    skin cancer face in past, hx. Rosacea(face,chest.   Past Surgical History  Procedure Laterality Date  . Cataract extraction, bilateral  2000    bilateral  . Orchiectomy  early 70's    right testicle removed(deteriation)  . Vasectomy  1980's  . Nasal cautery  2013    due to nose bleed  . Hernia repair      x3  . Knee arthroscopy  05/19/2012    Procedure: ARTHROSCOPY KNEE;  Surgeon: Gearlean Alf, MD;  Location: [facility];  Service: Orthopedics;  Laterality: Left;  with Synovectomy  . Hemorrhoid surgery  1980s  . Hemorrhoid banding  01/03/2013    x2 piles  . Skin cancer destruction    . Mouth surgery  1970's    "remove bone from mouth"  . Prostate surgery  1970's    "gland surgery"  . Eye surgery  2000    Glaucoma surgery  . Joint replacement Bilateral 2013, 2014  . Carpal tunnel release Bilateral 2000  . Tympanostomy tube placement Right 2010, 2015    x2    Home Medications:  Prescriptions prior  to admission  Medication Sig Dispense Refill  . Biotin 5000 MCG TABS Take 1 tablet by mouth daily.      . budesonide-formoterol (SYMBICORT) 160-4.5 MCG/ACT inhaler Inhale 2 puffs into the lungs 2 (two) times daily.       . Calcium Carb-Cholecalciferol (CALCIUM 600 + D PO) Take 1 tablet by mouth 2 (two) times daily.      . furosemide (LASIX) 20 MG tablet Take 40 mg by mouth every morning.       Marland Kitchen ipratropium-albuterol (DUONEB) 0.5-2.5 (3) MG/3ML SOLN Take 3 mLs by nebulization every 4 (four) hours as needed (for wheezing).       Marland Kitchen latanoprost (XALATAN) 0.005 % ophthalmic solution Place 1 drop into both eyes at bedtime.      Marland Kitchen lisinopril (PRINIVIL,ZESTRIL) 5 MG tablet Take 5 mg by mouth every morning.      . ondansetron (ZOFRAN) 4 MG tablet Take 4 mg by mouth every 6 (six) hours as needed for nausea or vomiting.      . polyethylene glycol (MIRALAX / GLYCOLAX) packet Take 17 g by mouth daily.      Marland Kitchen  potassium chloride SA (K-DUR,KLOR-CON) 20 MEQ tablet Take 20 mEq by mouth every morning.      . ranitidine (ZANTAC) 150 MG tablet Take 150 mg by mouth 2 (two) times daily.      . simvastatin (ZOCOR) 40 MG tablet Take 40 mg by mouth every evening.      Marland Kitchen ammonium lactate (LAC-HYDRIN) 12 % lotion Apply 1 application topically daily.      . benzonatate (TESSALON) 200 MG capsule Take 200 mg by mouth 3 (three) times daily as needed for cough.      . betamethasone valerate lotion (VALISONE) 0.1 % Apply 1 application topically 2 (two) times daily.      . Hydrocodone-Chlorpheniramine 5-4 MG/5ML SOLN Take 5 mLs by mouth every 12 (twelve) hours as needed (for cough).       Allergies: No Known Allergies  Family History  Problem Relation Age of Onset  . Heart attack Father   . Stroke Mother   . Diabetes Brother    Social History:  reports that he quit smoking about 45 years ago. His smoking use included Cigarettes. He has a 30 pack-year smoking history. He has quit using smokeless tobacco. His smokeless  tobacco use included Chew and Snuff. He reports that he does not drink alcohol or use illicit drugs.  ROS: A complete review of systems was performed.  All systems are negative except for pertinent findings as noted. ROS   Physical Exam:  Vital signs in last 24 hours: Temp:  [98 F (36.7 C)] 98 F (36.7 C) (09/08 0847) Pulse Rate:  [73] 73 (09/08 0847) Resp:  [18] 18 (09/08 0847) BP: (138)/(73) 138/73 mmHg (09/08 0847) SpO2:  [100 %] 100 % (09/08 0847) Weight:  [92.08 kg (203 lb)] 92.08 kg (203 lb) (09/08 0800) General:  Alert and oriented, No acute distress HEENT: Normocephalic, atraumatic Neck: No JVD or lymphadenopathy Cardiovascular: Regular rate and rhythm Lungs: Regular rate and effort Abdomen: Soft, nontender, nondistended, no abdominal masses Back: No CVA tenderness Extremities: No edema Neurologic: Grossly intact  Laboratory Data:  Results for orders placed during the hospital encounter of 05/09/14 (from the past 24 hour(s))  GLUCOSE, CAPILLARY     Status: Abnormal   Collection Time    05/09/14  9:32 AM      Result Value Ref Range   Glucose-Capillary 105 (*) 70 - 99 mg/dL   Comment 1 Documented in Chart     Comment 2 Notify RN     No results found for this or any previous visit (from the past 240 hour(s)). 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 All questions answered. Patient elects to proceed. Discussed if bladder neck patent in OR we may or may night incise it. Discussed we have to balance risk of stricture and incontinence with  achieving his goal of better flow and emptying among the other concerns. He elects to proceed.     Festus Aloe 05/09/2014, 11:36 AM

## 2014-05-09 NOTE — Anesthesia Preprocedure Evaluation (Addendum)
Anesthesia Evaluation  Patient identified by MRN, date of birth, ID band Patient awake    Reviewed: Allergy & Precautions, H&P , NPO status , Patient's Chart, lab work & pertinent test results  Airway Mallampati: II TM Distance: >3 FB Neck ROM: Full    Dental no notable dental hx.    Pulmonary shortness of breath, asthma , sleep apnea , COPD COPD inhaler, former smoker,  breath sounds clear to auscultation  Pulmonary exam normal       Cardiovascular hypertension, Pt. on medications + dysrhythmias Rhythm:Regular Rate:Normal     Neuro/Psych  Headaches, negative psych ROS   GI/Hepatic Neg liver ROS, GERD-  Medicated,  Endo/Other  negative endocrine ROSdiabetes  Renal/GU negative Renal ROS  negative genitourinary   Musculoskeletal  (+) Arthritis -,   Abdominal   Peds negative pediatric ROS (+)  Hematology negative hematology ROS (+)   Anesthesia Other Findings   Reproductive/Obstetrics negative OB ROS                          Anesthesia Physical Anesthesia Plan  ASA: III  Anesthesia Plan: General   Post-op Pain Management:    Induction: Intravenous  Airway Management Planned: LMA  Additional Equipment:   Intra-op Plan:   Post-operative Plan: Extubation in OR  Informed Consent: I have reviewed the patients History and Physical, chart, labs and discussed the procedure including the risks, benefits and alternatives for the proposed anesthesia with the patient or authorized representative who has indicated his/her understanding and acceptance.   Dental advisory given  Plan Discussed with: CRNA  Anesthesia Plan Comments: (Doing much better since July. Now on increased Lasix with good response. Breathing is good today.)       Anesthesia Quick Evaluation

## 2014-05-09 NOTE — Discharge Instructions (Signed)
Cystoscopy, Care After °Refer to this sheet in the next few weeks. These instructions provide you with information on caring for yourself after your procedure. Your caregiver may also give you more specific instructions. Your treatment has been planned according to current medical practices, but problems sometimes occur. Call your caregiver if you have any problems or questions after your procedure. °HOME CARE INSTRUCTIONS  °Things you can do to ease any discomfort after your procedure include: °· Drinking enough water and fluids to keep your urine clear or pale yellow. °· Taking a warm bath to relieve any burning feelings. °SEEK IMMEDIATE MEDICAL CARE IF:  °· You have an increase in blood in your urine. °· You notice blood clots in your urine. °· You have difficulty passing urine. °· You have the chills. °· You have abdominal pain. °· You have a fever or persistent symptoms for more than 2-3 days. °· You have a fever and your symptoms suddenly get worse. °MAKE SURE YOU:  °· Understand these instructions. °· Will watch your condition. °· Will get help right away if you are not doing well or get worse. °Document Released: 03/07/2005 Document Revised: 04/20/2013 Document Reviewed: 02/09/2012 °[employer]® Patient Information ©2015 [employer]. This information is not intended to replace advice given to you by your health care provider. Make sure you discuss any questions you have with your health care provider. ° ° ° °General Anesthesia, Care After °Refer to this sheet in the next few weeks. These instructions provide you with information on caring for yourself after your procedure. Your health care provider may also give you more specific instructions. Your treatment has been planned according to current medical practices, but problems sometimes occur. Call your health care provider if you have any problems or questions after your procedure. °WHAT TO EXPECT AFTER THE PROCEDURE °After the procedure, it is typical to  experience: °· Sleepiness. °· Nausea and vomiting. °HOME CARE INSTRUCTIONS °· For the first 24 hours after general anesthesia: °¨ Have a responsible person with you. °¨ Do not drive a car. If you are alone, do not take public transportation. °¨ Do not drink alcohol. °¨ Do not take medicine that has not been prescribed by your health care provider. °¨ Do not sign important papers or make important decisions. °¨ You may resume a normal diet and activities as directed by your health care provider. °· Change bandages (dressings) as directed. °· If you have questions or problems that seem related to general anesthesia, call the hospital and ask for the anesthetist or anesthesiologist on call. °SEEK MEDICAL CARE IF: °· You have nausea and vomiting that continue the day after anesthesia. °· You develop a rash. °SEEK IMMEDIATE MEDICAL CARE IF:  °· You have difficulty breathing. °· You have chest pain. °· You have any allergic problems. °Document Released: 11/24/2000 Document Revised: 08/23/2013 Document Reviewed: 03/03/2013 °[employer]® Patient Information ©2015 [employer]. This information is not intended to replace advice given to you by your health care provider. Make sure you discuss any questions you have with your health care provider. ° °

## 2014-05-10 ENCOUNTER — Encounter (HOSPITAL_COMMUNITY): Payer: Self-pay | Admitting: Urology

## 2014-09-01 DEATH — deceased

## 2014-09-14 ENCOUNTER — Encounter (HOSPITAL_COMMUNITY): Payer: Self-pay | Admitting: Urology

## 2016-06-14 IMAGING — CR DG CHEST 2V
2 series · 2 of 2 positions shown · non-contrast
Comparison: 02/06/2014

CLINICAL DATA: Pre operative respiratory exam. Bladder neck
contracture.

EXAM:
CHEST  2 VIEW

[w chest pa]
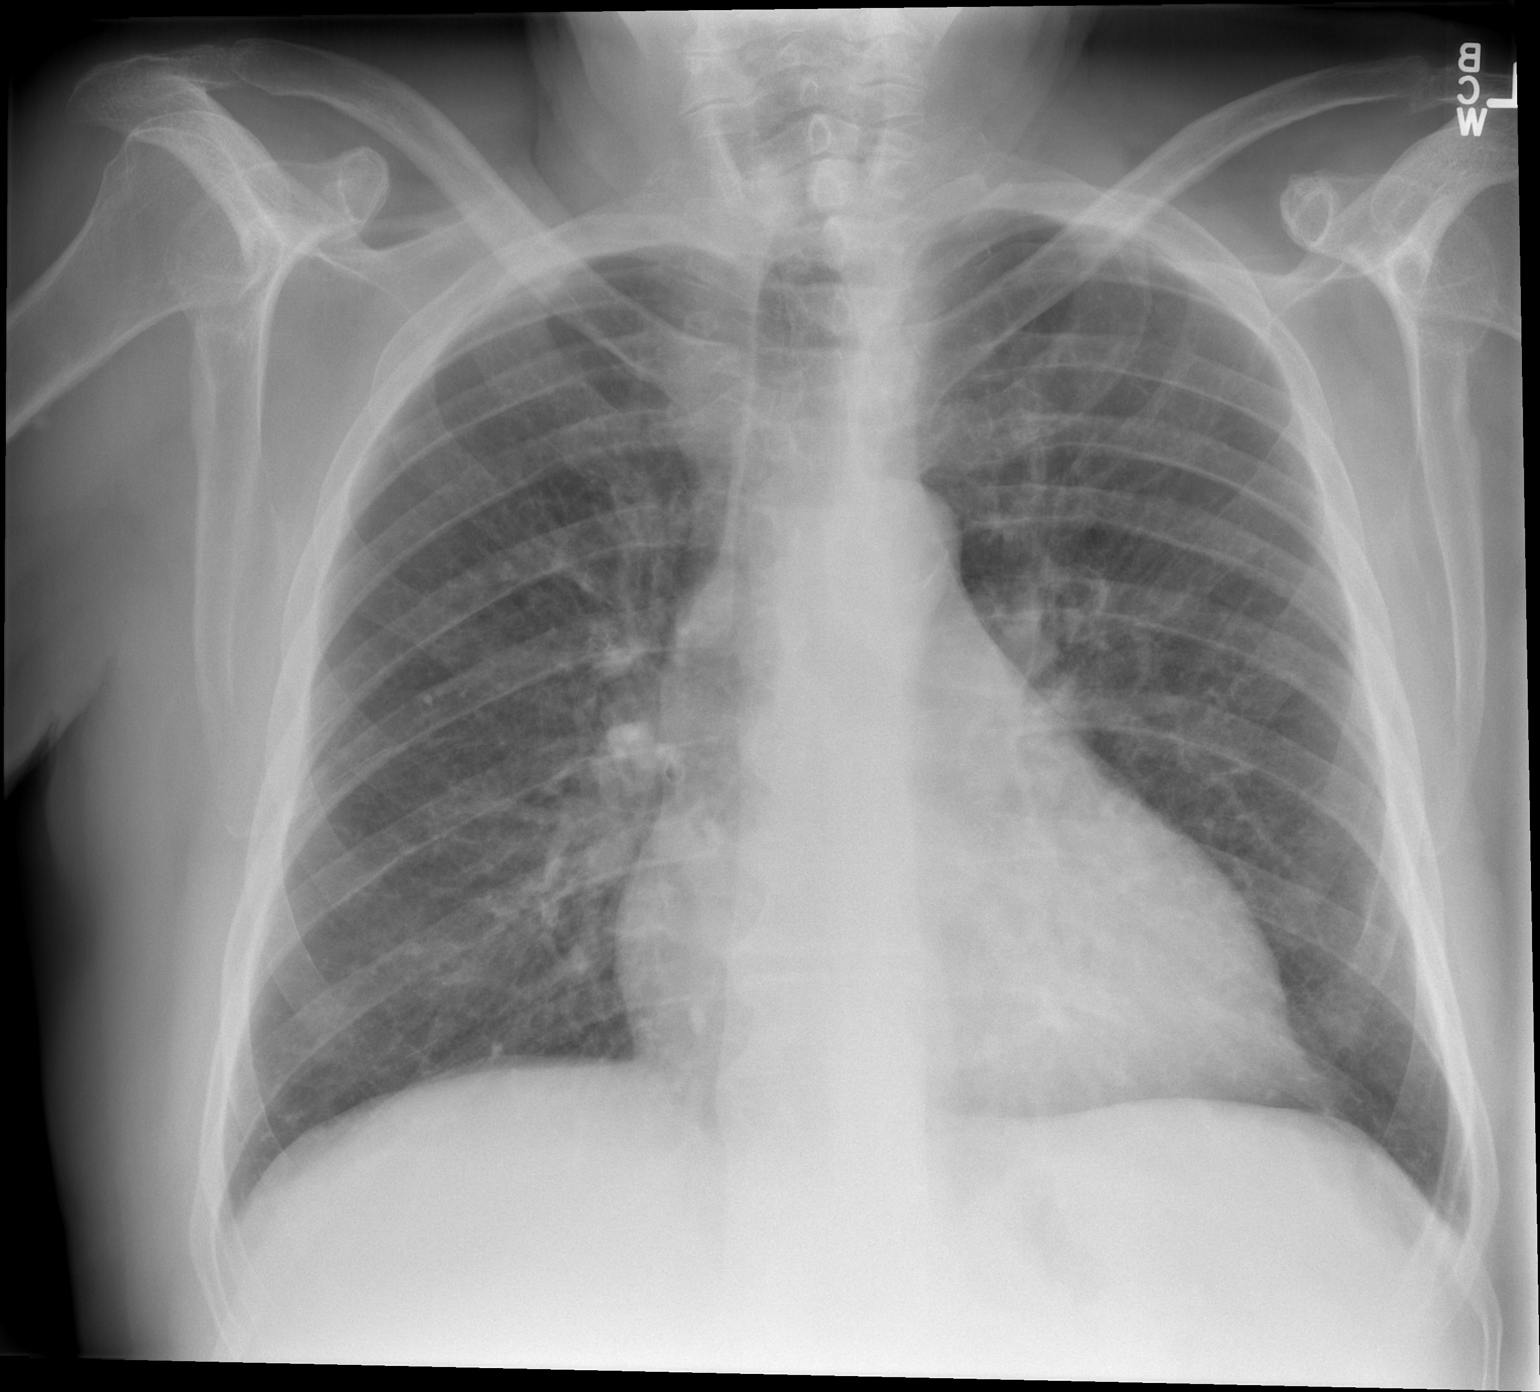

[w chest lat]
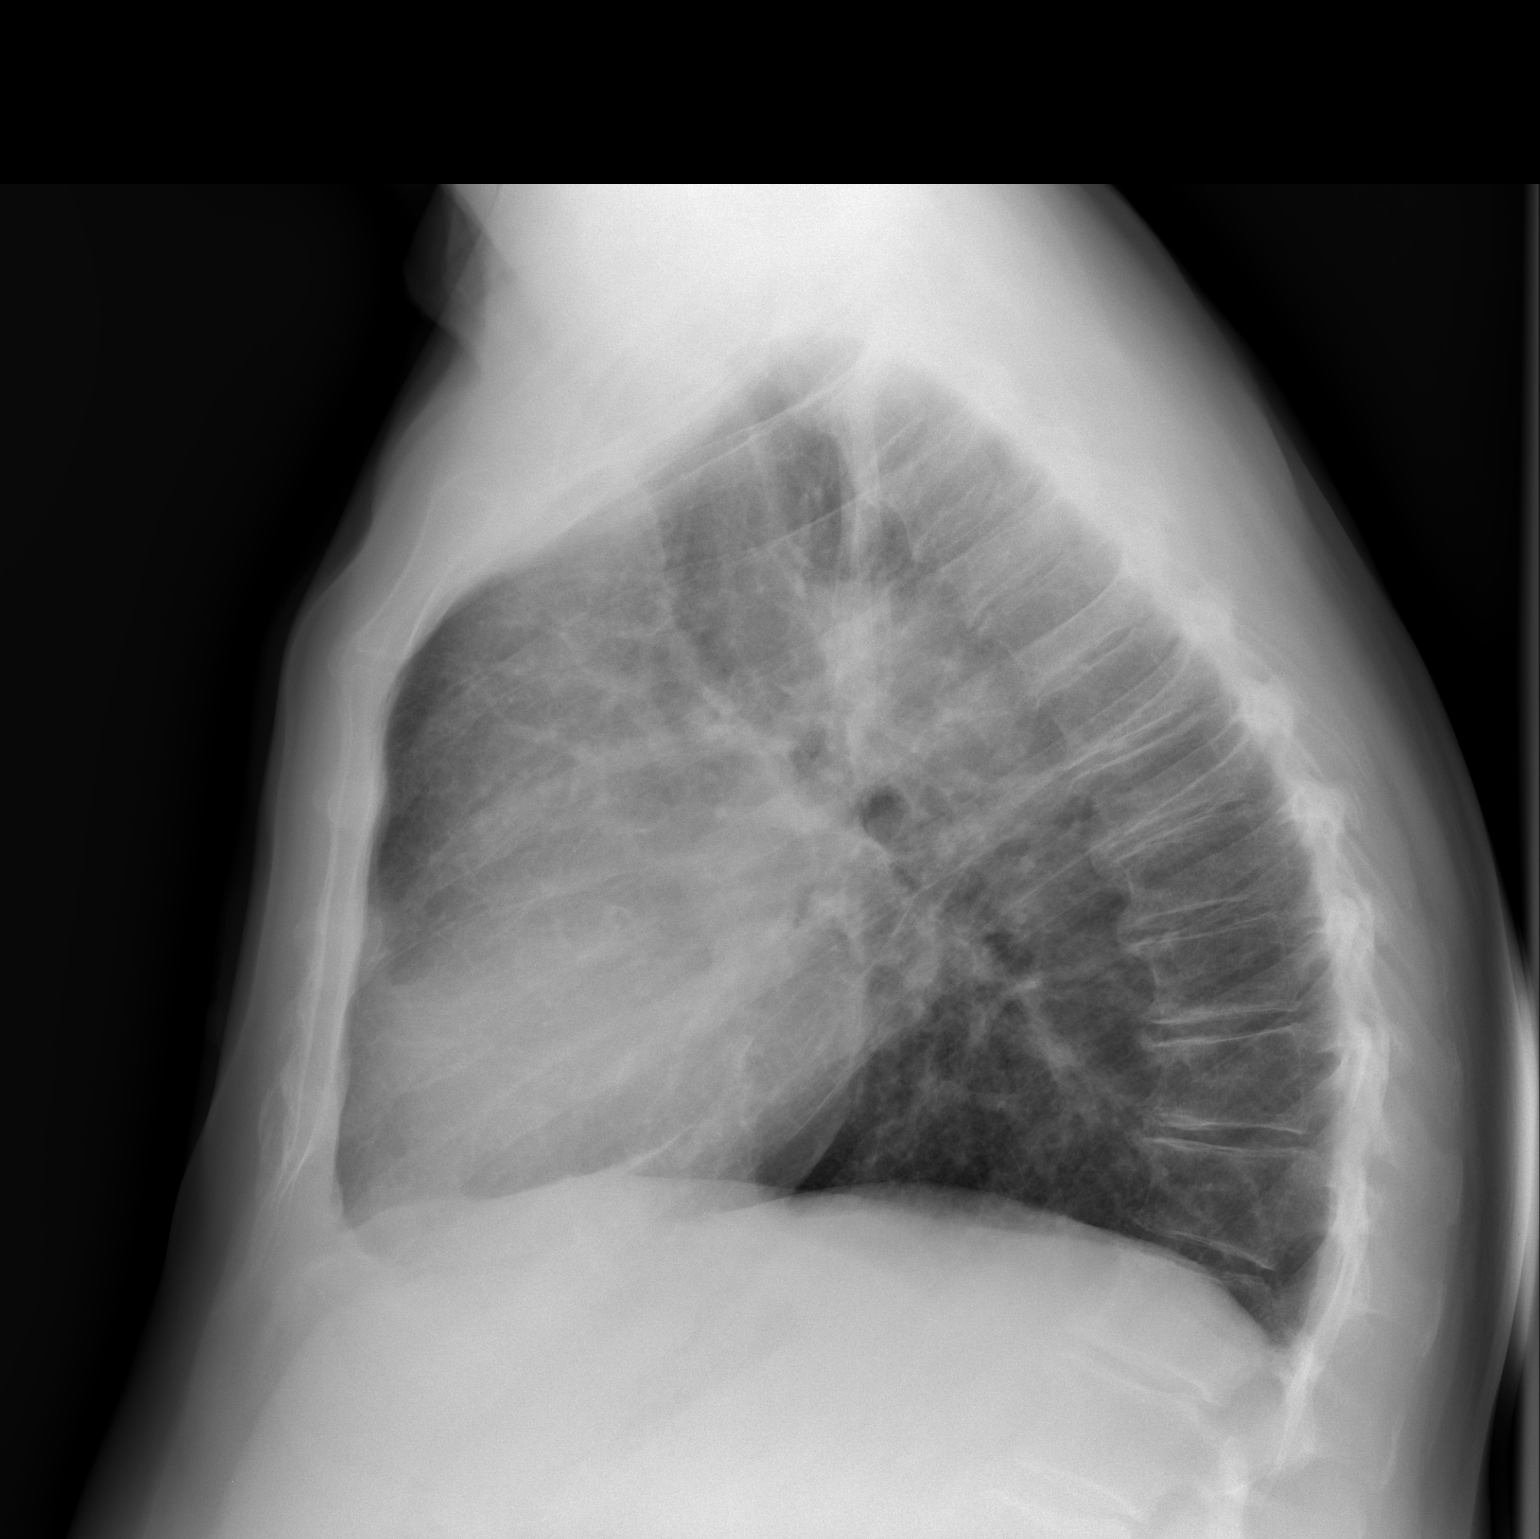

[2 of 2 positions shown; findings below may reference images not displayed]

FINDINGS: There is borderline cardiomegaly. Pulmonary vascularity is normal.
Lungs appear clear. The small pulmonary nodules seen on recent CT
scan are not appreciable on today's exam. There is no effusion.
There is slight accentuation of the lower thoracic kyphosis,
chronic.
IMPRESSION: No acute abnormalities.  Borderline cardiomegaly.
